# Patient Record
Sex: Male | Born: 1975 | Race: White | Hispanic: No | Marital: Married | Smoking: Former smoker
Health system: Southern US, Community
[De-identification: ages and names within clinical notes are randomized; demographics above are authoritative.]

## PROBLEM LIST (undated history)

## (undated) HISTORY — PX: APPENDECTOMY: SHX54

---

## 2013-06-13 ENCOUNTER — Emergency Department (HOSPITAL_COMMUNITY)
Admission: EM | Admit: 2013-06-13 | Discharge: 2013-06-13 | Disposition: A | Discharge status: Home / Self Care | Payer: Self-pay | Attending: Emergency Medicine | Admitting: Emergency Medicine

## 2013-06-13 ENCOUNTER — Encounter (HOSPITAL_COMMUNITY): Payer: Self-pay | Admitting: Emergency Medicine

## 2013-06-13 DIAGNOSIS — Z792 Long term (current) use of antibiotics: Secondary | ICD-10-CM | POA: Insufficient documentation

## 2013-06-13 DIAGNOSIS — K047 Periapical abscess without sinus: Secondary | ICD-10-CM | POA: Insufficient documentation

## 2013-06-13 MED ORDER — PENICILLIN V POTASSIUM 500 MG PO TABS: 500.0000 mg | ORAL_TABLET | Freq: Four times a day (QID) | ORAL | Status: DC

## 2013-06-13 MED ORDER — HYDROCODONE-ACETAMINOPHEN 5-325 MG PO TABS: 1.0000 | ORAL_TABLET | Freq: Four times a day (QID) | ORAL | Status: DC | PRN

## 2013-06-13 MED ORDER — PENICILLIN V POTASSIUM 500 MG PO TABS
500.0000 mg | ORAL_TABLET | Freq: Once | ORAL | Status: AC
  Administered 2013-06-13: 500 mg via ORAL
  Filled 2013-06-13: qty 1

## 2013-06-13 NOTE — ED Notes (Signed)
Pt present with facial swelling to left side of face. Has broken tooth and states its coming from that. Pt has hx of abscess to right side of face.

## 2013-06-13 NOTE — ED Provider Notes (Signed)
  CSN: [id number]     Arrival date & time 06/13/13  1049 History     First MD Initiated Contact with Patient 06/13/13 1116     Chief Complaint  Patient presents with  . Abscess   (Consider location/radiation/quality/duration/timing/severity/associated sxs/prior Treatment) HPI Comments: The patient presents to the emergency department with chief complaint of dental pain/abscess. Patient states that he recently suffered a broken tooth. He states that in the past 3 days, he has had worsening pain and swelling. He states that his pain is severe. He is tried managing it with ibuprofen with some relief. He has a history of dental pain and abscesses. He states that he does not have a dentist, but requests resources. He denies difficulty breathing, or swallowing. Denies fevers, chills, nausea, or vomiting.  The history is provided by the patient. No language interpreter was used.    History reviewed. No pertinent past medical history. Past Surgical History  Procedure Laterality Date  . Appendectomy     No family history on file. History  Substance Use Topics  . Smoking status: Never Smoker   . Smokeless tobacco: Not on file  . Alcohol Use: No    Review of Systems  All other systems reviewed and are negative.    Allergies  Review of patient's allergies indicates no known allergies.  Home Medications   Current Outpatient Rx  Name  Route  Sig  Dispense  Refill  . ibuprofen (ADVIL,MOTRIN) 200 MG tablet   Oral   Take 800 mg by mouth every 6 (six) hours as needed for pain.         Marland Kitchen HYDROcodone-acetaminophen (NORCO/VICODIN) 5-325 MG per tablet   Oral   Take 1 tablet by mouth every 6 (six) hours as needed for pain.   12 tablet   0   . penicillin v potassium (VEETID) 500 MG tablet   Oral   Take 1 tablet (500 mg total) by mouth 4 (four) times daily.   40 tablet   0    BP 137/71  Pulse 81  Temp(Src) 98.3 F (36.8 C) (Oral)  Resp 18  SpO2 100% Physical Exam  Nursing note  and vitals reviewed. Constitutional: He is oriented to person, place, and time. He appears well-developed and well-nourished.  HENT:  Head: Normocephalic and atraumatic.  Mouth/Throat:    Poor dentition throughout.  Affected tooth as diagrammed.  No signs of peritonsillar or tonsillar abscess.  No signs of gingival abscess. Oropharynx is clear and without exudates.  Uvula is midline.  Airway is intact. No signs of Ludwig's angina.   Eyes: Conjunctivae and EOM are normal.  Neck: Normal range of motion.  Cardiovascular: Normal rate.   Pulmonary/Chest: Effort normal.  Abdominal: He exhibits no distension.  Musculoskeletal: Normal range of motion.  Neurological: He is alert and oriented to person, place, and time.  Skin: Skin is dry.  Psychiatric: He has a normal mood and affect. His behavior is normal. Judgment and thought content normal.    ED Course   Procedures (including critical care time)  Labs Reviewed - No data to display No results found. 1. Dental abscess     MDM  Patient with dental pain/abscess. Patient has mildly swollen left cheek. Will treat with penicillin and pain medicine.  Urged patient to follow-up with dentist.  Specific return precautions are given. The patient is stable and ready for discharge.   Roxy Horseman, PA-C 06/13/13 1159

## 2013-06-14 NOTE — ED Provider Notes (Signed)
Medical screening examination/treatment/procedure(s) were performed by non-physician practitioner and as supervising physician I was immediately available for consultation/collaboration.   Daejon Lich M Yanilen Adamik, MD 06/14/13 0735 

## 2013-06-15 ENCOUNTER — Emergency Department (HOSPITAL_COMMUNITY)
Admission: EM | Admit: 2013-06-15 | Discharge: 2013-06-15 | Disposition: A | Discharge status: Home / Self Care | Payer: Self-pay | Attending: Emergency Medicine | Admitting: Emergency Medicine

## 2013-06-15 ENCOUNTER — Encounter (HOSPITAL_COMMUNITY): Payer: Self-pay | Admitting: Emergency Medicine

## 2013-06-15 DIAGNOSIS — R609 Edema, unspecified: Secondary | ICD-10-CM | POA: Insufficient documentation

## 2013-06-15 DIAGNOSIS — K047 Periapical abscess without sinus: Secondary | ICD-10-CM | POA: Insufficient documentation

## 2013-06-15 MED ORDER — HYDROCODONE-ACETAMINOPHEN 5-325 MG PO TABS: 1.0000 | ORAL_TABLET | Freq: Four times a day (QID) | ORAL | Status: DC | PRN

## 2013-06-15 MED ORDER — AMOXICILLIN 500 MG PO CAPS
500.0000 mg | ORAL_CAPSULE | Freq: Once | ORAL | Status: AC
  Administered 2013-06-15: 500 mg via ORAL
  Filled 2013-06-15: qty 1

## 2013-06-15 MED ORDER — AMOXICILLIN 500 MG PO CAPS: 500.0000 mg | ORAL_CAPSULE | Freq: Three times a day (TID) | ORAL | Status: DC

## 2013-06-15 NOTE — ED Notes (Signed)
Pt presents with swelling to left side of face near mouth and nose. Pain 10/10. States its an abscess and has been like this since Monday.

## 2013-06-15 NOTE — ED Provider Notes (Signed)
CSN: [id number]     Arrival date & time 06/15/13  1350 History     First MD Initiated Contact with Patient 06/15/13 1355     Chief Complaint  Patient presents with  . Abscess   (Consider location/radiation/quality/duration/timing/severity/associated sxs/prior Treatment) HPI Comments: Patient presenting with pain of the left upper tooth that has been present for the past weeks.  He reports that the pain is associated with some swelling of the left side of his face that has been present for the past 2-3 days.  Swelling and pain gradually worsening.  He was seen in the ED for the same two days ago and was started on PCN.  He reports that typically Amoxicillin works better for dental abscesses than the PCN and is requesting to have the antibiotic changed.  He has been taking Hydrocodone for the pain, which he reports is helping.  He reports that he currently does not have a dentist.  He denies fever or chills.  Denies trismus.  Denies difficulty swallowing.  Patient is a 37 y.o. male presenting with tooth pain. The history is provided by the patient.  Dental Pain Associated symptoms: facial pain, facial swelling and gum swelling   Associated symptoms: no difficulty swallowing, no drooling, no fever, no neck pain, no neck swelling and no trismus     History reviewed. No pertinent past medical history. Past Surgical History  Procedure Laterality Date  . Appendectomy     History reviewed. No pertinent family history. History  Substance Use Topics  . Smoking status: Never Smoker   . Smokeless tobacco: Not on file  . Alcohol Use: No    Review of Systems  Constitutional: Negative for fever.  HENT: Positive for facial swelling. Negative for drooling and neck pain.     Allergies  Review of patient's allergies indicates no known allergies.  Home Medications   Current Outpatient Rx  Name  Route  Sig  Dispense  Refill  . HYDROcodone-acetaminophen (NORCO/VICODIN) 5-325 MG per tablet  Oral   Take 1 tablet by mouth every 6 (six) hours as needed for pain.   12 tablet   0   . ibuprofen (ADVIL,MOTRIN) 200 MG tablet   Oral   Take 800 mg by mouth every 6 (six) hours as needed for pain.         Marland Kitchen penicillin v potassium (VEETID) 500 MG tablet   Oral   Take 1 tablet (500 mg total) by mouth 4 (four) times daily.   40 tablet   0    BP 151/85  Pulse 72  Temp(Src) 98.4 F (36.9 C) (Oral)  Resp 20  SpO2 100% Physical Exam  Nursing note and vitals reviewed. Constitutional: He is oriented to person, place, and time. He appears well-developed and well-nourished. No distress.  HENT:  Head: Normocephalic and atraumatic.  Mouth/Throat: Uvula is midline, oropharynx is clear and moist and mucous membranes are normal. No trismus in the jaw. Abnormal dentition. No dental abscesses or edematous. No oropharyngeal exudate, posterior oropharyngeal edema, posterior oropharyngeal erythema or tonsillar abscesses.  Poor dental hygiene. Pt able to open and close mouth with out difficulty. Airway intact. Uvula midline. Dental abscess of the left upper gingiva.   No swelling or tenderness of submental and submandibular regions.  Eyes: Conjunctivae and EOM are normal.  Neck: Normal range of motion and full passive range of motion without pain. Neck supple.  Cardiovascular: Normal rate, regular rhythm and normal heart sounds.   Pulmonary/Chest: Effort normal and breath  sounds normal.  Musculoskeletal: Normal range of motion.  Lymphadenopathy:       Head (right side): No submental, no submandibular, no tonsillar, no preauricular and no posterior auricular adenopathy present.       Head (left side): No submental, no submandibular, no tonsillar, no preauricular and no posterior auricular adenopathy present.    He has no cervical adenopathy.  Neurological: He is alert and oriented to person, place, and time.  Skin: Skin is warm and dry. He is not diaphoretic.  Psychiatric: He has a normal mood  and affect.    ED Course   Procedures (including critical care time)  Labs Reviewed - No data to display No results found. No diagnosis found.  INCISION AND DRAINAGE Performed by: Anne Shutter, Jaimi Belle Consent: Verbal consent obtained. Risks and benefits: risks, benefits and alternatives were discussed Type: abscess  Body area: left upper gingiva  Anesthesia: local infiltration  Incision was made with a scalpel.  Local anesthetic: lidocaine 2% with epinephrine  Anesthetic total: 2 ml  Complexity: complex Blunt dissection to break up loculations  Drainage: purulent  Drainage amount: moderate  Patient tolerance: Patient tolerated the procedure well with no immediate complications.     MDM  Patient presenting with left upper dental abscess.  Abscess incised and drained in the ED.  Marland Kitchen  Exam unconcerning for Ludwig's angina or spread of infection.  Patient reports that Amoxicillin works better for him than PCN when he has had dental abscesses in the past.  Therefore, patient started on Amoxicillin.   Urged patient to follow-up with dentist.    Pascal Lux Blanchard, PA-C 06/15/13 1526

## 2013-06-16 NOTE — ED Provider Notes (Signed)
Medical screening examination/treatment/procedure(s) were performed by non-physician practitioner and as supervising physician I was immediately available for consultation/collaboration.    Terrin Imparato W. Michela Herst, MD 06/16/13 1825 

## 2013-07-09 ENCOUNTER — Emergency Department (HOSPITAL_COMMUNITY)
Admission: EM | Admit: 2013-07-09 | Discharge: 2013-07-09 | Disposition: A | Discharge status: Home / Self Care | Payer: Self-pay | Attending: Emergency Medicine | Admitting: Emergency Medicine

## 2013-07-09 ENCOUNTER — Encounter (HOSPITAL_COMMUNITY): Payer: Self-pay

## 2013-07-09 DIAGNOSIS — K047 Periapical abscess without sinus: Secondary | ICD-10-CM | POA: Insufficient documentation

## 2013-07-09 MED ORDER — CLINDAMYCIN HCL 150 MG PO CAPS: 300.0000 mg | ORAL_CAPSULE | Freq: Three times a day (TID) | ORAL | Status: DC

## 2013-07-09 MED ORDER — AMOXICILLIN 500 MG PO CAPS: 1000.0000 mg | ORAL_CAPSULE | Freq: Two times a day (BID) | ORAL | Status: DC

## 2013-07-09 NOTE — ED Provider Notes (Signed)
Medical screening examination/treatment/procedure(s) were performed by non-physician practitioner and as supervising physician I was immediately available for consultation/collaboration.  Shon Baton, MD 07/09/13 1806

## 2013-07-09 NOTE — ED Notes (Signed)
Patient states he was here 1 1/2 weeks ago for the abscess.Patient states he received antibiotics and pain meds. patient states he took all of the antibiotics and his face swelled again. Patient states he has an appointment on 9/25 at a clinic

## 2013-07-09 NOTE — ED Provider Notes (Signed)
CSN: [id number]     Arrival date & time 07/09/13  1202 History   First MD Initiated Contact with Patient 07/09/13 1220     Chief Complaint  Patient presents with  . dental abscess    (Consider location/radiation/quality/duration/timing/severity/associated sxs/prior Treatment) Patient is a 37 y.o. male presenting with tooth pain. The history is provided by the patient. No language interpreter was used.  Dental Pain Location:  Upper Upper teeth location:  11/LU cuspid Quality:  Dull Severity:  Moderate Associated symptoms: facial swelling   Associated symptoms: no fever   Associated symptoms comment:  Recurrence of left sided facial swelling and pain involving the upper left canine tooth. No fever. No N, V. He has follow up on the 25th of this month.   History reviewed. No pertinent past medical history. Past Surgical History  Procedure Laterality Date  . Appendectomy     Family History  Problem Relation Age of Onset  . Heart failure Father   . Cancer Father    History  Substance Use Topics  . Smoking status: Never Smoker   . Smokeless tobacco: Never Used  . Alcohol Use: No    Review of Systems  Constitutional: Negative for fever and chills.  HENT: Positive for facial swelling and dental problem. Negative for ear pain and trouble swallowing.        Toothache.  Gastrointestinal: Negative.  Negative for nausea.  Neurological: Negative.     Allergies  Review of patient's allergies indicates no known allergies.  Home Medications   Current Outpatient Rx  Name  Route  Sig  Dispense  Refill  . ibuprofen (ADVIL,MOTRIN) 200 MG tablet   Oral   Take 600 mg by mouth every 6 (six) hours as needed for pain.           BP 137/89  Pulse 70  Temp(Src) 98.1 F (36.7 C) (Oral)  Ht 6\' 1"  (1.854 m)  Wt 218 lb (98.884 kg)  BMI 28.77 kg/m2  SpO2 98% Physical Exam  Constitutional: He appears well-developed and well-nourished. No distress.  HENT:  Left sided facial swelling  adjacent to #11. Moderate to severe dental decay of this tooth. No pointing abscess visualized.     ED Course  Procedures (including critical care time) Labs Review Labs Reviewed - No data to display Imaging Review No results found.  MDM  No diagnosis found. 1. Recurrent dental abscess  Last Rx for Amoxil was 7 days, at which time he was having recurrent symptoms of pain and swelling. He prefers Amoxil. Will write for 10 day course, also given a Rx for Clinda if pain/swelling are not resolving with Amoxil.     Arnoldo Hooker, PA-C 07/09/13 1249

## 2019-07-19 DIAGNOSIS — H5213 Myopia, bilateral: Secondary | ICD-10-CM | POA: Diagnosis not present

## 2019-08-18 DIAGNOSIS — H5213 Myopia, bilateral: Secondary | ICD-10-CM | POA: Diagnosis not present

## 2019-08-27 HISTORY — PX: DENTAL SURGERY: SHX609

## 2019-11-14 ENCOUNTER — Ambulatory Visit (INDEPENDENT_AMBULATORY_CARE_PROVIDER_SITE_OTHER): Payer: Medicaid Other | Admitting: Internal Medicine

## 2019-11-14 ENCOUNTER — Ambulatory Visit (INDEPENDENT_AMBULATORY_CARE_PROVIDER_SITE_OTHER): Payer: Medicaid Other

## 2019-11-14 ENCOUNTER — Other Ambulatory Visit: Payer: Self-pay

## 2019-11-14 VITALS — BP 149/94 | HR 73 | Temp 97.3°F | Resp 17 | Ht 71.0 in | Wt 285.6 lb

## 2019-11-14 DIAGNOSIS — M5412 Radiculopathy, cervical region: Secondary | ICD-10-CM

## 2019-11-14 DIAGNOSIS — M25512 Pain in left shoulder: Secondary | ICD-10-CM

## 2019-11-14 DIAGNOSIS — Z720 Tobacco use: Secondary | ICD-10-CM | POA: Diagnosis not present

## 2019-11-14 DIAGNOSIS — R030 Elevated blood-pressure reading, without diagnosis of hypertension: Secondary | ICD-10-CM | POA: Insufficient documentation

## 2019-11-14 DIAGNOSIS — Z7689 Persons encountering health services in other specified circumstances: Secondary | ICD-10-CM

## 2019-11-14 DIAGNOSIS — M4802 Spinal stenosis, cervical region: Secondary | ICD-10-CM | POA: Diagnosis not present

## 2019-11-14 DIAGNOSIS — M19012 Primary osteoarthritis, left shoulder: Secondary | ICD-10-CM | POA: Diagnosis not present

## 2019-11-14 MED ORDER — GABAPENTIN 300 MG PO CAPS: 300.0000 mg | ORAL_CAPSULE | Freq: Every day | ORAL | 2 refills | Status: DC

## 2019-11-14 MED ORDER — MELOXICAM 15 MG PO TABS: 15.0000 mg | ORAL_TABLET | Freq: Every day | ORAL | 2 refills | Status: DC

## 2019-11-14 NOTE — Progress Notes (Signed)
Patient ID: Kevin Stephenson, male    DOB: 1976-07-26  MRN: [id number]  CC: Establish Care and Shoulder Pain (L shoulder pain x 5-6 months. no known injury. varies from dull ache to sharp stabbing pain. has some numbness/tingling radiating down his left arm. when it bads he'll drop things if he's holding it in his L hand)   Subjective: Kevin Stephenson is a 44 y.o. male who presents for new pt visit at [facility] His concerns today include:   No previous PCP.  No chronic med issues.  C/o pain in LT shoulder x 5-6 mths.  Constant but severity changes.  Started gradually with intermittent pain but got worse and now constant.   Yesterday he slept on it.  Could not move it without using his other hand. Can only lift it to a little less than 90 degress. +Weakness in grip and over all strength in the arm.  Drop things at times when trying to grip with LT hand Intermittent numbness in arm and hand Hurts to lay on LT side.  No known initiating factors Endorses neck pain on LT side for a few yrs.  Some days pain in neck  is okay and other days he feels it.  Radiates toward LT shoulder.  + shooting pain down the arm from shoulder intermittently in mornings.  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  On good days pain 4-5/10 and bad days 20/10.    BP elev today.  Pt reports BP always high when he is in doctor's office.  But goes down after sitting for 5-10 mins.   No CP/SOB with exertion.  No LE edema.   Tob Dep:  Quit 5 yrs ago.  Now vapes. Had quit but started vaping again 04/2019 due to stress of COVID.  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   Past medical, surgical, family history, social history reviewed and updated  There are no problems to display for this patient.    No current outpatient medications on file prior to visit.   No current  facility-administered medications on file prior to visit.    No Known Allergies  Social History   Socioeconomic History  . Marital status: Married    Spouse name: Not on file  . Number of children: 3  . Years of education: 28  . Highest education level: Not on file  Occupational History  . Not on file  Tobacco Use  . Smoking status: Former Smoker    Quit date: 2015    Years since quitting: 6.0  . Smokeless tobacco: Never Used  Substance and Sexual Activity  . Alcohol use: No  . Drug use: No  . Sexual activity: Not on file  Other Topics Concern  . Not on file  Social History Narrative  . Not on file   Social Determinants of Health   Financial Resource Strain:   . Difficulty of Paying Living Expenses: Not on file  Food Insecurity:   . Worried About Programme researcher, broadcasting/film/video in the Last Year: Not on file  . Ran Out of Food in the Last Year: Not on file  Transportation Needs:   . Lack of Transportation (Medical): Not on file  . Lack of Transportation (Non-Medical): Not on file  Physical Activity:   . Days of Exercise per Week: Not  on file  . Minutes of Exercise per Session: Not on file  Stress:   . Feeling of Stress : Not on file  Social Connections:   . Frequency of Communication with Friends and Family: Not on file  . Frequency of Social Gatherings with Friends and Family: Not on file  . Attends Religious Services: Not on file  . Active Member of Clubs or Organizations: Not on file  . Attends Archivist Meetings: Not on file  . Marital Status: Not on file  Intimate Partner Violence:   . Fear of Current or Ex-Partner: Not on file  . Emotionally Abused: Not on file  . Physically Abused: Not on file  . Sexually Abused: Not on file    Family History  Problem Relation Age of Onset  . Heart failure Father   . Colon cancer Father   . Heart disease Father     Past Surgical History:  Procedure Laterality Date  . APPENDECTOMY      ROS: Review of  Systems Negative except as stated above  PHYSICAL EXAM: BP (!) 154/89   Pulse 73   Temp (!) 97.3 F (36.3 C) (Temporal)   Resp 17   Ht 5\' 11"  (1.803 m)   Wt 285 lb 9.6 oz (129.5 kg)   SpO2 98%   BMI 39.83 kg/m   149/94.  Then 136/98 Physical Exam General appearance - alert, well appearing, middle-aged Caucasian male and in no distress Mental status - normal mood, behavior, speech, dress, motor activity, and thought processes Eyes - pupils equal and reactive, extraocular eye movements intact Mouth -patient is edentulous Neck -no cervical lymphadenopathy.  No thyromegaly Chest - clear to auscultation, no wheezes, rales or rhonchi, symmetric air entry Heart - normal rate, regular rhythm, normal S1, S2, no murmurs, rubs, clicks or gallops Neurological -grip 5/5 right, 4/5 left.  Power proximally and distally in the upper extremities 5/5 bilaterally.  He has mild discomfort with passive range of motion in all directions.  Drop arm test negative.  He is able to rotate the arm around to his back.  He experiences most discomfort with passive elevation of the arm to about 40 to 50 degrees. Decreased sensation to gross touch in the left forearm compared to the right. Musculoskeletal -no tenderness on palpation of the cervical spine.  Mild discomfort with passive rotation of the neck to the left. Left shoulder joint: Patient of the anterior posterior joint. Extremities -no lower extremity edema.  3+ radial pulses  Patient declines baseline lab test today  ASSESSMENT AND PLAN: 1. Encounter to establish care 2. Pain in joint of left shoulder Patient appears to have issues with the left shoulder and also some radicular symptoms that seem to be coming from the neck.  I recommend that we start with baseline x-rays of the left shoulder and the cervical spine. Stop ibuprofen.  Start meloxicam.  Start low-dose of gabapentin at bedtime. Refer to orthopedics - DG Shoulder Left; Future - Ambulatory  referral to Orthopedic Surgery - meloxicam (MOBIC) 15 MG tablet; Take 1 tablet (15 mg total) by mouth daily.  Dispense: 30 tablet; Refill: 2  3. Cervical radicular pain See #1 above - DG Cervical Spine Complete; Future - Ambulatory referral to Orthopedic Surgery - gabapentin (NEURONTIN) 300 MG capsule; Take 1 capsule (300 mg total) by mouth at bedtime.  Dispense: 30 capsule; Refill: 2  4. Elevated blood pressure reading DASH diet discussed and encouraged.  I have given him a prescription to  get home blood pressure monitoring device.  Advised to check blood pressure at least once or twice a week and bring in readings on next visit.  5. Vapes nicotine containing substance Commended him on quitting smoking.  However I have discussed the risks of vaping.  Encouraged him to discontinue.  Patient states that he is weaning himself plans to quit    Patient was given the opportunity to ask questions.  Patient verbalized understanding of the plan and was able to repeat key elements of the plan.   Orders Placed This Encounter  Procedures  . DG Cervical Spine Complete  . DG Shoulder Left  . Ambulatory referral to Orthopedic Surgery     Requested Prescriptions   Signed Prescriptions Disp Refills  . meloxicam (MOBIC) 15 MG tablet 30 tablet 2    Sig: Take 1 tablet (15 mg total) by mouth daily.  Marland Kitchen gabapentin (NEURONTIN) 300 MG capsule 30 capsule 2    Sig: Take 1 capsule (300 mg total) by mouth at bedtime.    Return in about 6 weeks (around 12/26/2019) for for BP recheck.  Jonah Blue, MD, FACP

## 2019-11-14 NOTE — Patient Instructions (Signed)
Your blood pressure is elevated.  Normal is 120/80 or lower.  Try to limit salt in the foods.  Try to get a blood pressure monitoring device and check your blood pressure 1-2 times a week and bring in readings on next visit.    I have referred you to orthopedics for your shoulder/neck.   Start Gabapentin 300 mg at bedtime.  This is for nerve pain.  Start Meloxicam daily.  This is an antiinflammatory medication.  Stop the Ibuprofen

## 2019-11-16 NOTE — Progress Notes (Signed)
Patient notified of results & recommendations via voicemail(per his request). Advised to call back if he had any questions.

## 2019-11-23 ENCOUNTER — Encounter: Payer: Self-pay | Admitting: Family Medicine

## 2019-11-23 ENCOUNTER — Other Ambulatory Visit: Payer: Self-pay

## 2019-11-23 ENCOUNTER — Ambulatory Visit (INDEPENDENT_AMBULATORY_CARE_PROVIDER_SITE_OTHER): Payer: Medicaid Other | Admitting: Family Medicine

## 2019-11-23 DIAGNOSIS — G8929 Other chronic pain: Secondary | ICD-10-CM

## 2019-11-23 DIAGNOSIS — M25512 Pain in left shoulder: Secondary | ICD-10-CM | POA: Diagnosis not present

## 2019-11-23 MED ORDER — PREDNISONE 10 MG PO TABS
ORAL_TABLET | ORAL | 0 refills | Status: DC
Start: 2019-11-23 — End: 2019-12-25

## 2019-11-23 NOTE — Patient Instructions (Signed)
  Shoulder arthritis treatment:  Glucosamine Sulfate:  1,000 mg twice daily  Turmeric:  500 mg twice daily  Theraband exercises 3 days per week (start when pain is better)   Future options: - Physical therapy - Cortisone injection

## 2019-11-23 NOTE — Progress Notes (Signed)
Office Visit Note   Patient: Kevin Stephenson           Date of Birth: 09-24-1976           MRN: [id number] Visit Date: 11/23/2019 Requested by: Marcine Matar, MD [address] [place],  [place] [address] PCP: No primary care provider on file.  Subjective: Chief Complaint  Patient presents with  . Left Shoulder - Pain    Pain x 5-6 months, NKI. Worse over the past 2 months. Less strength in the left arm. Occasional numbness in the left hand. Some days are worse than others. Occasional problems sleeping due to pain.    HPI: He is here with left shoulder pain.  No injury, gradual onset of pain about 5 or 6 months ago.  Pain deep inside the shoulder, he points to the anterior aspect.  He cannot reproduce the pain by palpation.  It hurts constantly, keeps him from sleeping at night.  He recently started gabapentin and meloxicam with minimal improvement.  No previous shoulder dislocations or major trauma.  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  He has some neck pain as well with occasional radicular symptoms into his left arm.  He recently had x-rays of cervical spine and left shoulder and now presents for evaluation.  He is right-hand dominant.  No family history of rheumatologic disease to his knowledge.  He is a former smoker.                ROS: No fevers or chills.  All other systems were reviewed and are negative.  Objective: Vital Signs: There were no vitals taken for this visit.  Physical Exam:  General:  Alert and oriented, in no acute distress. Pulm:  Breathing unlabored. Psy:  Normal mood, congruent affect. Skin: No rash Left shoulder: He has decreased overhead reach compared to the right, and slightly decreased behind the back reach.  Isometric rotator cuff strength is 5/5, biceps, triceps, wrist and intrinsic hand strength are 5/5.  He has no pain with rotator cuff testing.  No tenderness at  the [facility] joint.  Imaging: X-rays of the cervical spine show moderate to severe lower cervical degenerative disc disease.  X-rays left shoulder show moderate glenohumeral degenerative change with subchondral cystic change.  Moderate to severe AC joint arthropathy.    Assessment & Plan: 1.  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  Home exercises given for maintenance. -If symptoms persist, could try physical therapy or glenohumeral injection.    Procedures: No procedures performed  No notes on file     PMFS History: Patient Active Problem List   Diagnosis Date Noted  . Pain in joint of left shoulder 11/14/2019  . Cervical radicular pain 11/14/2019  . Elevated blood pressure reading 11/14/2019  . Vapes nicotine containing substance 11/14/2019   History reviewed. No pertinent past medical history.  Family History  Problem Relation Age of Onset  . Heart failure Father   . Colon cancer Father   . Heart disease Father     Past Surgical History:  Procedure Laterality Date  . APPENDECTOMY    . DENTAL SURGERY  08/2019   all teeth removed   Social History   Occupational History  . Occupation: Truck Hospital doctor  Tobacco Use  . Smoking status: Former Smoker    Quit date: 2015  Years since quitting: 6.0  . Smokeless tobacco: Never Used  Substance and Sexual Activity  . Alcohol use: No  . Drug use: No  . Sexual activity: Not on file

## 2019-12-18 ENCOUNTER — Other Ambulatory Visit: Payer: Self-pay | Admitting: Internal Medicine

## 2019-12-25 ENCOUNTER — Telehealth: Payer: Self-pay

## 2019-12-25 NOTE — Telephone Encounter (Signed)

## 2019-12-25 NOTE — Patient Instructions (Addendum)
Thank you for choosing Primary Care at [facility] to be your medical home!    Kevin Stephenson was seen by De Hollingshead, DO today.   Kevin Stephenson's primary care provider is [name], DO.   For the best care possible, you should try to see [name], DO whenever you come to the clinic.   We look forward to seeing you again soon!  If you have any questions about your visit today, please call us at [phone] or feel free to reach your primary care provider via MyChart.      Low-Purine Eating Plan A low-purine eating plan involves making food choices to limit your intake of purine. Purine is a kind of uric acid. Too much uric acid in your blood can cause certain conditions, such as gout and kidney stones. Eating a low-purine diet can help control these conditions. What are tips for following this plan? Reading food labels   Avoid foods with saturated or Trans fat.  Check the ingredient list of grains-based foods, such as bread and cereal, to make sure that they contain whole grains.  Check the ingredient list of sauces or soups to make sure they do not contain meat or fish.  When choosing soft drinks, check the ingredient list to make sure they do not contain high-fructose corn syrup. Shopping  Buy plenty of fresh fruits and vegetables.  Avoid buying canned or fresh fish.  Buy dairy products labeled as low-fat or nonfat.  Avoid buying premade or processed foods. These foods are often high in fat, salt (sodium), and added sugar. Cooking  Use olive oil instead of butter when cooking. Oils like olive oil, canola oil, and sunflower oil contain healthy fats. Meal planning  Learn which foods do or do not affect you. If you find out that a food tends to cause your gout symptoms to flare up, avoid eating that food. You can enjoy foods that do not cause problems. If you have any questions about a food item, talk with your dietitian or health care  provider.  Limit foods high in fat, especially saturated fat. Fat makes it harder for your body to get rid of uric acid.  Choose foods that are lower in fat and are lean sources of protein. General guidelines  Limit alcohol intake to no more than 1 drink a day for nonpregnant women and 2 drinks a day for men. One drink equals 12 oz of beer, 5 oz of wine, or 1 oz of hard liquor. Alcohol can affect the way your body gets rid of uric acid.  Drink plenty of water to keep your urine clear or pale yellow. Fluids can help remove uric acid from your body.  If directed by your health care provider, take a vitamin C supplement.  Work with your health care provider and dietitian to develop a plan to achieve or maintain a healthy weight. Losing weight can help reduce uric acid in your blood. What foods are recommended? The items listed may not be a complete list. Talk with your dietitian about what dietary choices are best for you. Foods low in purines Foods low in purines do not need to be limited. These include:  All fruits.  All low-purine vegetables, pickles, and olives.  Breads, pasta, rice, cornbread, and popcorn. Cake and other baked goods.  All dairy foods.  Eggs, nuts, and nut butters.  Spices and condiments, such as salt, herbs, and vinegar.  Plant oils, butter, and margarine.  Water, sugar-free soft  drinks, tea, coffee, and cocoa.  Vegetable-based soups, broths, sauces, and gravies. Foods moderate in purines Foods moderate in purines should be limited to the amounts listed.   cup of asparagus, cauliflower, spinach, mushrooms, or green peas, each day.  2/3 cup uncooked oatmeal, each day.   cup dry wheat bran or wheat germ, each day.  2-3 ounces of meat or poultry, each day.  4-6 ounces of shellfish, such as crab, lobster, oysters, or shrimp, each day.  1 cup cooked beans, peas, or lentils, each day.  Soup, broths, or bouillon made from meat or fish. Limit these  foods as much as possible. What foods are not recommended? The items listed may not be a complete list. Talk with your dietitian about what dietary choices are best for you. Limit your intake of foods high in purines, including:  Beer and other alcohol.  Meat-based gravy or sauce.  Canned or fresh fish, such as: ? Anchovies, sardines, herring, and tuna. ? Mussels and scallops. ? Codfish, trout, and haddock.  Kevin Stephenson.  Organ meats, such as: ? Liver or kidney. ? Tripe. ? Sweetbreads (thymus gland or pancreas).  Wild Clinical biochemist.  Yeast or yeast extract supplements.  Drinks sweetened with high-fructose corn syrup. Summary  Eating a low-purine diet can help control conditions caused by too much uric acid in the body, such as gout or kidney stones.  Choose low-purine foods, limit alcohol, and limit foods high in fat.  You will learn over time which foods do or do not affect you. If you find out that a food tends to cause your gout symptoms to flare up, avoid eating that food. This information is not intended to replace advice given to you by your health care provider. Make sure you discuss any questions you have with your health care provider. Document Revised: 09/24/2017 Document Reviewed: 11/25/2016 Elsevier Patient Education  2020 [employer].

## 2019-12-26 ENCOUNTER — Other Ambulatory Visit: Payer: Self-pay

## 2019-12-26 ENCOUNTER — Encounter: Payer: Self-pay | Admitting: Internal Medicine

## 2019-12-26 ENCOUNTER — Ambulatory Visit (INDEPENDENT_AMBULATORY_CARE_PROVIDER_SITE_OTHER): Payer: Medicaid Other | Admitting: Internal Medicine

## 2019-12-26 VITALS — BP 133/90 | HR 61 | Temp 97.5°F | Resp 17 | Wt 297.4 lb

## 2019-12-26 DIAGNOSIS — M25512 Pain in left shoulder: Secondary | ICD-10-CM | POA: Diagnosis not present

## 2019-12-26 DIAGNOSIS — Z114 Encounter for screening for human immunodeficiency virus [HIV]: Secondary | ICD-10-CM | POA: Diagnosis not present

## 2019-12-26 DIAGNOSIS — M109 Gout, unspecified: Secondary | ICD-10-CM

## 2019-12-26 DIAGNOSIS — R030 Elevated blood-pressure reading, without diagnosis of hypertension: Secondary | ICD-10-CM

## 2019-12-26 MED ORDER — MELOXICAM 15 MG PO TABS: 15.0000 mg | ORAL_TABLET | Freq: Every day | ORAL | 2 refills | Status: DC

## 2019-12-26 MED ORDER — COLCHICINE 0.6 MG PO TABS
0.6000 mg | ORAL_TABLET | Freq: Two times a day (BID) | ORAL | 0 refills | Status: DC
Start: 2019-12-26 — End: 2019-12-27

## 2019-12-26 NOTE — Progress Notes (Signed)
Subjective:    Kevin Stephenson - 44 y.o. male MRN [id number]  Date of birth: September 27, 1976  HPI  Charlies Stephenson is here for follow up of elevated BP reading. Patient was seen to establish care on 1/19. Noted to have BP of 154/89 without h/o HTN. DASH diet discussed and recommended patient start monitoring BPs at home. Today, he reports that he has been monitoring his BP at home. Numbers have all been 749S systolic or less. No chest pain, headaches, SOB, vision changes.    He is concerned that he may be having a gout flare in his left ankle. He believes he has had them in his big toes before. Never been diagnosed with gout but has had it where his joint gets red, swollen, and warm. He has self treated with NSAIDs in the past. Tried this time and not going away. No known precipitating event or injury. Has full ROM with his ankle. Pain feels deep within the joint.       Health Maintenance:  Health Maintenance Due  Topic Date Due  . HIV Screening  02/05/1991    -  reports that he quit smoking about 6 years ago. He has never used smokeless tobacco. - Review of Systems: Per HPI. - Past Medical History: Patient Active Problem List   Diagnosis Date Noted  . Pain in joint of left shoulder 11/14/2019  . Cervical radicular pain 11/14/2019  . Elevated blood pressure reading 11/14/2019  . Vapes nicotine containing substance 11/14/2019   - Medications: reviewed and updated   Objective:   Physical Exam BP 133/90   Pulse 61   Temp (!) 97.5 F (36.4 C) (Temporal)   Resp 17   Wt 297 lb 6.4 oz (134.9 kg)   SpO2 98%   BMI 41.48 kg/m  Physical Exam  Constitutional: He is oriented to person, place, and time and well-developed, well-nourished, and in no distress. No distress.  HENT:  Head: Normocephalic and atraumatic.  Eyes: Conjunctivae and EOM are normal.  Cardiovascular: Normal rate, regular rhythm and normal heart sounds.  No murmur heard. Pulmonary/Chest: Effort normal and breath  sounds normal. No respiratory distress.  Musculoskeletal:        General: Normal range of motion.     Comments: Left ankle with some edema and increased warmth, no erythema. No pain to palpation. Full ROM at joint.   Neurological: He is alert and oriented to person, place, and time.  Skin: Skin is warm and dry. He is not diaphoretic.  Psychiatric: Affect and judgment normal.           Assessment & Plan:   1. Elevated blood pressure reading BP improved greatly this time and on home readings. Will continue to monitor. Encouraged DASH diet and increased exercise as well as weight loss for improved BP control over time.   2. Screening for HIV (human immunodeficiency virus) - HIV antibody (with reflex)  3. Pain in joint of left shoulder - meloxicam (MOBIC) 15 MG tablet; Take 1 tablet (15 mg total) by mouth daily.  Dispense: 30 tablet; Refill: 2  4. Gout of left ankle, unspecified cause, unspecified chronicity History and symptoms concerning for gout. No known injury or exam findings that would warrant imaging at present. Will obtain BMET as don't know baseline kidney function. In meantime will prescribe Colchicine for presumed gout flare to take BID until flare resolves. Can continue NSAID for treatment.  - Uric Acid - Basic Metabolic Panel - colchicine 0.6 MG tablet; Take 1  tablet (0.6 mg total) by mouth 2 (two) times daily.  Dispense: 30 tablet; Refill: 0     Kevin Stephenson, D.O. 12/26/2019, 10:57 AM Primary Care at [facility]

## 2019-12-27 ENCOUNTER — Other Ambulatory Visit: Payer: Self-pay

## 2019-12-27 LAB — BASIC METABOLIC PANEL
BUN/Creatinine Ratio: 16 (ref 9–20)
BUN: 16 mg/dL (ref 6–24)
CO2: 24 mmol/L (ref 20–29)
Calcium: 9.6 mg/dL (ref 8.7–10.2)
Chloride: 103 mmol/L (ref 96–106)
Creatinine, Ser: 1 mg/dL (ref 0.76–1.27)
GFR calc Af Amer: 106 mL/min/{1.73_m2} (ref >59)
GFR calc non Af Amer: 92 mL/min/{1.73_m2} (ref >59)
Glucose: 84 mg/dL (ref 65–99)
Potassium: 4 mmol/L (ref 3.5–5.2)
Sodium: 142 mmol/L (ref 134–144)

## 2019-12-27 LAB — URIC ACID: Uric Acid: 6.3 mg/dL (ref 3.8–8.4)

## 2019-12-27 LAB — HIV ANTIBODY (ROUTINE TESTING W REFLEX): HIV Screen 4th Generation wRfx: NONREACTIVE

## 2019-12-27 MED ORDER — MITIGARE 0.6 MG PO CAPS: ORAL_CAPSULE | ORAL | 0 refills | Status: DC

## 2019-12-27 NOTE — Progress Notes (Signed)
Received prior authorization request for patient's Colchicine tablets. Checked the Medicaid preferred drug list & Medicaid prefers the Mitigare capsules. Sent Rx for Mitagare to pharmacy on file.

## 2019-12-27 NOTE — Progress Notes (Signed)
Patient notified of results & recommendations. Expressed understanding.

## 2020-03-07 ENCOUNTER — Other Ambulatory Visit: Payer: Self-pay | Admitting: Internal Medicine

## 2020-03-07 MED ORDER — MITIGARE 0.6 MG PO CAPS: ORAL_CAPSULE | ORAL | 0 refills | Status: DC

## 2020-06-27 ENCOUNTER — Telehealth: Payer: Medicaid Other | Admitting: Internal Medicine

## 2020-07-17 ENCOUNTER — Telehealth: Payer: Medicaid Other

## 2020-10-10 ENCOUNTER — Ambulatory Visit (INDEPENDENT_AMBULATORY_CARE_PROVIDER_SITE_OTHER): Payer: Medicaid Other | Admitting: Internal Medicine

## 2020-10-10 ENCOUNTER — Other Ambulatory Visit: Payer: Self-pay

## 2020-10-10 ENCOUNTER — Encounter: Payer: Self-pay | Admitting: Internal Medicine

## 2020-10-10 VITALS — BP 148/98 | HR 70 | Temp 97.9°F | Resp 17 | Wt 325.0 lb

## 2020-10-10 DIAGNOSIS — M5412 Radiculopathy, cervical region: Secondary | ICD-10-CM | POA: Diagnosis not present

## 2020-10-10 DIAGNOSIS — M19012 Primary osteoarthritis, left shoulder: Secondary | ICD-10-CM | POA: Diagnosis not present

## 2020-10-10 DIAGNOSIS — F411 Generalized anxiety disorder: Secondary | ICD-10-CM | POA: Diagnosis not present

## 2020-10-10 MED ORDER — SERTRALINE HCL 25 MG PO TABS: ORAL_TABLET | ORAL | 1 refills | Status: DC

## 2020-10-10 NOTE — Progress Notes (Signed)
Subjective:    Kevin Stephenson - 44 y.o. male MRN [id number]  Date of birth: [birth year]  HPI  Kevin Stephenson is here for follow up of neck pain. Has been present for about 18 months in cervical spine radiating down back and also experiences left shoulder pain with pain and numbness radiating down left arm. Was seen for this on 11/14/19 and had imaging obtained. Then had f/u with orthopedics 1/28 and was given 7 day course of steroids which he reports did not improve pain. He was unable to return as they did not accept his insurance. Reports he is unable to work or do any type of physical activity without severe symptoms. Has reduced grip strength and frequently drops items. Has to keep his head positioned in certain ways to avoid pain. Sometimes feels off balance. Most pain relief he is able to achieve comes from [facility] edibles. Tylenol arthritis helps somewhat.    Suffering from a lot of anxiety. Feels overwhelmed by his thoughts and keeps thinking about the same things repetitively. Feels like he then has trouble focusing on tasks and easily forgets what he is supposed to be doing. Has gotten a lot worse over past few months. Thinks may be related to being at home so much due to his pain and limitations as above.   Health Maintenance:  Health Maintenance Due  Topic Date Due  . Hepatitis C Screening  Never done  . COVID-19 Vaccine (1) Never done  . INFLUENZA VACCINE  Never done    -  reports that he quit smoking about 6 years ago. He has never used smokeless tobacco. - Review of Systems: Per HPI. - Past Medical History: Patient Active Problem List   Diagnosis Date Noted  . Pain in joint of left shoulder 11/14/2019  . Cervical radicular pain 11/14/2019  . Elevated blood pressure reading 11/14/2019  . Vapes nicotine containing substance 11/14/2019   - Medications: reviewed and updated   Objective:   Physical Exam BP (!) 148/98   Pulse 70   Temp 97.9 F (36.6 C) (Temporal)   Resp 17    Wt (!) 325 lb (147.4 kg)   SpO2 96%   BMI 45.33 kg/m  Physical Exam Constitutional:      General: He is not in acute distress.    Appearance: He is not diaphoretic.  Cardiovascular:     Rate and Rhythm: Normal rate.  Pulmonary:     Effort: Pulmonary effort is normal. No respiratory distress.  Musculoskeletal:        General: Normal range of motion.  Skin:    General: Skin is warm and dry.  Neurological:     Mental Status: He is alert and oriented to person, place, and time.  Psychiatric:        Mood and Affect: Affect normal.        Judgment: Judgment normal.            Assessment & Plan:   1. Cervical radicular pain C-spine imaging showed osteoarthritic changes at several levels, most noticeably at C5-6 and C6-7. Suspect severe disc narrowing is causing nerve compression and is the culprit for many of the symptoms in his left UE as well. Needs to re-establish with orthopedics for further management.  - Ambulatory referral to Orthopedic Surgery  2. Osteoarthritis of left shoulder, unspecified osteoarthritis type Reviewed that x-ray of left shoulder in Jan 2021 showed moderate generalized arthritic changes. Patient would likely benefit from intraarticular steroid injection. Ortho referral.  -  Ambulatory referral to Orthopedic Surgery  3. Anxiety state Will start Zoloft therapy for anxiety. Discussed potential side effects and need to take daily for best efficacy. Also discussed can take 4-6 weeks to see any change in symptoms. Consider counseling or appointment with Jenel Lucks, LCSW as well. Follow up in 2 months with new medication therapy.    - sertraline (ZOLOFT) 25 MG tablet; Take one tablet daily. If tolerating, increase to two tablets in one week.  Dispense: 60 tablet; Refill: 1     [name], D.O. 10/10/2020, 3:50 PM Primary Care at [facility]

## 2020-10-28 ENCOUNTER — Other Ambulatory Visit: Payer: Self-pay

## 2020-10-28 ENCOUNTER — Ambulatory Visit (INDEPENDENT_AMBULATORY_CARE_PROVIDER_SITE_OTHER): Payer: Medicaid Other | Admitting: Family Medicine

## 2020-10-28 ENCOUNTER — Encounter: Payer: Self-pay | Admitting: Family Medicine

## 2020-10-28 DIAGNOSIS — G8929 Other chronic pain: Secondary | ICD-10-CM | POA: Diagnosis not present

## 2020-10-28 DIAGNOSIS — M5010 Cervical disc disorder with radiculopathy, unspecified cervical region: Secondary | ICD-10-CM

## 2020-10-28 DIAGNOSIS — M25512 Pain in left shoulder: Secondary | ICD-10-CM

## 2020-10-28 MED ORDER — TRAMADOL HCL 50 MG PO TABS: 50.0000 mg | ORAL_TABLET | Freq: Four times a day (QID) | ORAL | 0 refills | Status: DC | PRN

## 2020-10-28 MED ORDER — NABUMETONE 750 MG PO TABS: 750.0000 mg | ORAL_TABLET | Freq: Two times a day (BID) | ORAL | 6 refills | Status: DC | PRN

## 2020-10-28 NOTE — Progress Notes (Signed)
   Office Visit Note   Patient: Kevin Stephenson           Date of Birth: 1975/10/31           MRN: [id number] Visit Date: 10/28/2020 Requested by: Arvilla Market, DO [address] [place],  [place] [address] PCP: Arvilla Market, DO  Subjective: Chief Complaint  Patient presents with  . Neck - Pain    Neck hurts all the time. Constant stiffness, decreased ROM.  Marland Kitchen Left Shoulder - Pain    Pain in the shoulder, with numbness. Some days he has weakness in the left hand - drops objects. Cannot raise the left arm all the way up.    HPI: He is here for follow-up chronic neck and left arm pain.  Last year I gave him prednisone but it did not help.  He continues to have daily pain radiating into his hand causing a numbness and tingling sensation in his hand.  He drops things frequently because he is not feeling them, and his arm feels weak.  His neck pops frequently and sometimes it feels like his legs are going to give out.  He had to stop work because of his pain.               ROS:   All other systems were reviewed and are negative.  Objective: Vital Signs: There were no vitals taken for this visit.  Physical Exam:  General:  Alert and oriented, in no acute distress. Pulm:  Breathing unlabored. Psy:  Normal mood, congruent affect.  Neck: He has equivocal Spurling's test.  He is tender to palpation to the left of C5-6.  He has good range of motion of the shoulder today with no palpable crepitus.  Upper extremity strength and reflexes are still normal except for intrinsic hand strength which is slightly weak.  Negative Tinel's at the carpal tunnel, negative Phalen's test.  Imaging: No results found.  Assessment & Plan: 1.  Chronic neck and left arm pain, suspect due to cervical radiculopathy.  He does have glenohumeral degenerative change, but I do not think it is the source of his pain. -Tramadol and Relafen as needed.  MRI of the cervical spine ordered.  MRI is  totally normal, then consider glenohumeral injection.     Procedures: No procedures performed        PMFS History: Patient Active Problem List   Diagnosis Date Noted  . Pain in joint of left shoulder 11/14/2019  . Cervical radicular pain 11/14/2019  . Elevated blood pressure reading 11/14/2019  . Vapes nicotine containing substance 11/14/2019   History reviewed. No pertinent past medical history.  Family History  Problem Relation Age of Onset  . Heart failure Father   . Colon cancer Father   . Heart disease Father     Past Surgical History:  Procedure Laterality Date  . APPENDECTOMY    . DENTAL SURGERY  08/2019   all teeth removed   Social History   Occupational History  . Occupation: [employer] doctor  Tobacco Use  . Smoking status: Former Smoker    Quit date: 2015    Years since quitting: 7.0  . Smokeless tobacco: Never Used  Vaping Use  . Vaping Use: Every day  Substance and Sexual Activity  . Alcohol use: No  . Drug use: No  . Sexual activity: Not on file

## 2020-11-05 ENCOUNTER — Other Ambulatory Visit: Payer: Self-pay | Admitting: Family Medicine

## 2020-11-05 ENCOUNTER — Telehealth: Payer: Self-pay | Admitting: Family Medicine

## 2020-11-05 DIAGNOSIS — M25512 Pain in left shoulder: Secondary | ICD-10-CM

## 2020-11-05 DIAGNOSIS — M5010 Cervical disc disorder with radiculopathy, unspecified cervical region: Secondary | ICD-10-CM

## 2020-11-05 DIAGNOSIS — G8929 Other chronic pain: Secondary | ICD-10-CM

## 2020-11-05 NOTE — Telephone Encounter (Signed)
Jacki Cones with [facility] called stating she needs a precertification in order to get the pt scheduled for an MRI and she states it has been in medical review for awhile. She would like a CB to get this as she is trying to schedule the pt for this Saturday.   Jacki Cones CB# [phone]

## 2020-11-05 NOTE — Telephone Encounter (Signed)
Sending to you to handle, please

## 2020-11-06 ENCOUNTER — Telehealth: Payer: Self-pay | Admitting: Family Medicine

## 2020-11-06 NOTE — Telephone Encounter (Signed)
I called the pharmacy, pt picked up the tramadol, but the nabumetone needed prior auth. I have submitted on cover my meds.  Pending approval

## 2020-11-06 NOTE — Telephone Encounter (Signed)
Pt states that he can't get his medication and it was prescribed on the 3rd. They said they faxed a pre auth form to the office. Can you give him a call please [phone]

## 2020-11-06 NOTE — Telephone Encounter (Signed)
I called and left a voice message - we have not received anything from his pharmacy. I will call the pharmacy and see if it is just one of the medications or both that need prior authorization vs changing.

## 2020-11-07 ENCOUNTER — Other Ambulatory Visit: Payer: Self-pay

## 2020-11-07 ENCOUNTER — Telehealth (INDEPENDENT_AMBULATORY_CARE_PROVIDER_SITE_OTHER): Payer: Medicaid Other | Admitting: Physician Assistant

## 2020-11-07 ENCOUNTER — Encounter: Payer: Self-pay | Admitting: Physician Assistant

## 2020-11-07 DIAGNOSIS — R12 Heartburn: Secondary | ICD-10-CM | POA: Diagnosis not present

## 2020-11-07 DIAGNOSIS — F411 Generalized anxiety disorder: Secondary | ICD-10-CM

## 2020-11-07 DIAGNOSIS — T50905A Adverse effect of unspecified drugs, medicaments and biological substances, initial encounter: Secondary | ICD-10-CM | POA: Diagnosis not present

## 2020-11-07 MED ORDER — OMEPRAZOLE 20 MG PO CPDR: 20.0000 mg | DELAYED_RELEASE_CAPSULE | Freq: Every day | ORAL | 0 refills | Status: DC

## 2020-11-07 NOTE — Telephone Encounter (Signed)
Approvedon January 12 Request Reference Number: [id number]. NABUMETONE TAB 750MG  is approved through 11/06/2021. For further questions, call 01/04/2022 at [phone].

## 2020-11-07 NOTE — Progress Notes (Unsigned)
Established Patient Office Visit  Subjective:  Patient ID: Kevin Stephenson, male    DOB: 1976/06/03  Age: 45 y.o. MRN: [id number]  CC:  Chief Complaint  Patient presents with  . Heartburn   Virtual Visit via Telephone Note  I connected with Kevin Stephenson on November 07, 2020 at  2:10 PM EST by telephone and verified that I am speaking with the correct person using two identifiers.  Location: Patient: Home Provider: Primary Care at [facility]   I discussed the limitations, risks, security and privacy concerns of performing an evaluation and management service by telephone and the availability of in person appointments. I also discussed with the patient that there may be a patient responsible charge related to this service. The patient expressed understanding and agreed to proceed.   History of Present Illness:  Kevin Stephenson reports that he was recenlty started on zoloft, states that he has been experiencing heartburn for 3 to 4 hours on a daily basis after taking it.  Started when he increased the dose 5 days ago to 50 mg, he states that he does feel that his symptoms did start improving, however does endorse yesterday he did have heartburn again.  Has tried taking it with or without food.  Is taking it at approximately 5 PM.  Does endorse that he feels it is offering great relief from his anxiety  Denies previous treatment for GERD, uncommon for him to have heartburn prior.  Observations/Objective: Medical history and current medications reviewed, no physical exam completed     History reviewed. No pertinent past medical history.  Past Surgical History:  Procedure Laterality Date  . APPENDECTOMY    . DENTAL SURGERY  08/2019   all teeth removed    Family History  Problem Relation Age of Onset  . Heart failure Father   . Colon cancer Father   . Heart disease Father     Social History   Socioeconomic History  . Marital status: Married    Spouse name: Not on file   . Number of children: 2  . Years of education: 70  . Highest education level: Not on file  Occupational History  . Occupation: [employer] doctor  Tobacco Use  . Smoking status: Former Smoker    Quit date: 2015    Years since quitting: 7.0  . Smokeless tobacco: Never Used  Vaping Use  . Vaping Use: Every day  Substance and Sexual Activity  . Alcohol use: No  . Drug use: No  . Sexual activity: Not on file  Other Topics Concern  . Not on file  Social History Narrative  . Not on file   Social Determinants of Health   Financial Resource Strain: Not on file  Food Insecurity: Not on file  Transportation Needs: Not on file  Physical Activity: Not on file  Stress: Not on file  Social Connections: Not on file  Intimate Partner Violence: Not on file    Outpatient Medications Prior to Visit  Medication Sig Dispense Refill  . nabumetone (RELAFEN) 750 MG tablet Take 1 tablet (750 mg total) by mouth 2 (two) times daily as needed. 60 tablet 6  . sertraline (ZOLOFT) 25 MG tablet Take one tablet daily. If tolerating, increase to two tablets in one week. 60 tablet 1  . traMADol (ULTRAM) 50 MG tablet Take 1 tablet (50 mg total) by mouth every 6 (six) hours as needed. 30 tablet 0   No facility-administered medications prior to visit.    No Known  Allergies  ROS Review of Systems  Constitutional: Negative.   HENT: Negative.   Eyes: Negative.   Respiratory: Negative for cough and chest tightness.   Cardiovascular: Negative.   Gastrointestinal: Positive for abdominal pain. Negative for diarrhea, nausea and vomiting.  Endocrine: Negative.   Genitourinary: Negative.   Musculoskeletal: Negative.   Skin: Negative.   Allergic/Immunologic: Negative.   Neurological: Negative.   Hematological: Negative.   Psychiatric/Behavioral: Negative.       Objective:     There were no vitals taken for this visit. Wt Readings from Last 3 Encounters:  10/10/20 (!) 325 lb (147.4 kg)  12/26/19 297 lb  6.4 oz (134.9 kg)  11/14/19 285 lb 9.6 oz (129.5 kg)     Health Maintenance Due  Topic Date Due  . Hepatitis C Screening  Never done  . COVID-19 Vaccine (1) Never done  . INFLUENZA VACCINE  Never done    There are no preventive care reminders to display for this patient.  No results found for: TSH No results found for: WBC, HGB, HCT, MCV, PLT Lab Results  Component Value Date   NA 142 12/26/2019   K 4.0 12/26/2019   CO2 24 12/26/2019   GLUCOSE 84 12/26/2019   BUN 16 12/26/2019   CREATININE 1.00 12/26/2019   CALCIUM 9.6 12/26/2019   No results found for: CHOL No results found for: HDL No results found for: LDLCALC No results found for: TRIG No results found for: CHOLHDL No results found for: BWGY6Z    Assessment & Plan:   Problem List Items Addressed This Visit   None   Visit Diagnoses    Heartburn    -  Primary   Relevant Medications   omeprazole (PRILOSEC) 20 MG capsule   Anxiety state       Medication adverse effect, initial encounter         Assessment and Plan:  1. Heartburn Trial Prilosec for 14 days. - omeprazole (PRILOSEC) 20 MG capsule; Take 1 capsule (20 mg total) by mouth daily.  Dispense: 14 capsule; Refill: 0  2. Anxiety state   3. Medication adverse effect, initial encounter Patient would like to resume medication due to the benefit of reduced anxiety.  Encouraged patient to move dosing to bedtime.  Encouraged patient to take Prilosec for the next 12 days and then stop for 2 days to see if heartburn adverse effect has resolved.  Patient to follow-up with mobile medicine unit in 2 weeks   Follow Up Instructions:    I discussed the assessment and treatment plan with the patient. The patient was provided an opportunity to ask questions and all were answered. The patient agreed with the plan and demonstrated an understanding of the instructions.   The patient was advised to call back or seek an in-person evaluation if the symptoms worsen or if  the condition fails to improve as anticipated.  I provided 21 minutes of non-face-to-face time during this encounter.   Meds ordered this encounter  Medications  . omeprazole (PRILOSEC) 20 MG capsule    Sig: Take 1 capsule (20 mg total) by mouth daily.    Dispense:  14 capsule    Refill:  0    Order Specific Question:   Supervising Provider    Answer:   Storm Frisk [[id number]]    Follow-up: Return in about 2 weeks (around 11/21/2020).    Kasandra Knudsen Lopaka Karge, PA-C

## 2020-11-07 NOTE — Telephone Encounter (Signed)
I called the patient and left a voice mail that he should now be able to pick up the nabumetone, as insurance did approve coverage.

## 2020-11-07 NOTE — Patient Instructions (Addendum)
I encourage you to move your dosing of Zoloft to bedtime.  Hopefully over the next 2 weeks the adverse effect that you are experiencing of heartburn will resolve.  In the meantime I encourage you to use Prilosec in the morning.  Now this may cover any heartburn symptoms that you are having, so near the end of the 2 weeks I want you to stop the Prilosec for a couple of days to see if your symptoms have resolved.  We will call you at the end of next week to schedule your follow-up appointment.  Please let us know if there is anything else we can do for you    Roney Jaffe, PA-C Physician Assistant [facility] [email or web address]   Omeprazole Tablets What is this medicine? OMEPRAZOLE (oh ME pray zol) prevents the production of acid in the stomach. It is used to treat the symptoms of heartburn. You can buy this medicine without a prescription. This product is not for long-term use, unless otherwise directed by your doctor or health care professional. This medicine may be used for other purposes; ask your health care provider or pharmacist if you have questions. COMMON BRAND NAME(S): Prilosec OTC What should I tell my health care provider before I take this medicine? They need to know if you have any of these conditions:  black or bloody stools  chest pain  difficulty swallowing  have had heartburn for over 3 months  have heartburn with dizziness, lightheadedness or sweating  liver disease  lupus  stomach pain  unexplained weight loss  vomiting with blood  wheezing  an unusual or allergic reaction to omeprazole, other medicines, foods, dyes, or preservatives  pregnant or trying to get pregnant  breast-feeding How should I use this medicine? Take this medicine by mouth with a glass of water. Follow the directions on the product label. Do not cut, crush or chew this medicine. Swallow the tablets whole. Take this  medicine on an empty stomach, at least 30 minutes before breakfast. Take your medicine at regular intervals. Do not take it more often than directed. Talk to your pediatrician regarding the use of this medicine in children. Special care may be needed. Overdosage: If you think you have taken too much of this medicine contact a poison control center or emergency room at once. NOTE: This medicine is only for you. Do not share this medicine with others. What if I miss a dose? If you miss a dose, take it as soon as you can. If it is almost time for your next dose, take only that dose. Do not take double or extra doses. What may interact with this medicine? Do not take this medicine with any of the following medications:  atazanavir  clopidogrel  nelfinavir  rilpivirine This medicine may also interact with the following medications:  antifungals like itraconazole, ketoconazole, and voriconazole  certain antivirals for HIV or hepatitis  certain medicines that treat or prevent blood clots like warfarin  cilostazol  citalopram  cyclosporine  dasatinib  digoxin  disulfiram  diuretics  erlotinib  iron supplements  medicines for anxiety, panic, and sleep like diazepam  medicines for seizures like carbamazepine, phenobarbital, phenytoin  methotrexate  mycophenolate mofetil  nilotinib  rifampin  St. John's wort  tacrolimus  vitamin B12 This list may not describe all possible interactions. Give your health care provider a list of all the medicines, herbs, non-prescription drugs, or dietary supplements you use. Also tell them if you smoke, drink alcohol, or  use illegal drugs. Some items may interact with your medicine. What should I watch for while using this medicine? It can take several days before your heartburn gets better. Tell your healthcare professional if your symptoms do not start to get better or if they get worse. If you need to take this medicine for more  than 14 days, talk to your healthcare professional. Heartburn may sometimes be caused by a more serious condition. This medicine may cause a decrease in vitamin B12. You should make sure that you get enough vitamin B12 while you are taking this medicine. Discuss the foods you eat and the vitamins you take with your health care professional. What side effects may I notice from receiving this medicine? Side effects that you should report to your doctor or health care professional as soon as possible:  allergic reactions like skin rash, itching or hives, swelling of the face, lips, or tongue  bone pain  breathing problems  fever or sore throat  joint pain  rash on cheeks or arms that gets worse in the sun  redness, blistering, peeling, or loosening of the skin, including inside the mouth  severe diarrhea  signs and symptoms of kidney injury like trouble passing urine or change in the amount of urine  signs and symptoms of low magnesium like muscle cramps; muscle pain; muscle weakness; tremors; seizures; or fast, irregular heartbeat  stomach polyps  unusual bleeding or bruising Side effects that usually do not require medical attention (report to your doctor or health care professional if they continue or are bothersome):  diarrhea  dry mouth  gas  headache  nausea  stomach pain This list may not describe all possible side effects. Call your doctor for medical advice about side effects. You may report side effects to FDA at 1-800-FDA-1088. Where should I keep my medicine? Keep out of the reach of children. Store at room temperature between 20 and 25 degrees C (68 and 77 degrees F). Protect from light and moisture. Throw away any unused medicine after the expiration date. NOTE: This sheet is a summary. It may not cover all possible information. If you have questions about this medicine, talk to your doctor, pharmacist, or health care provider.  2021 Elsevier/Gold Standard  (2020-08-21 18:47:00)

## 2020-11-07 NOTE — Progress Notes (Signed)
Patient verified DOB Patient complains of heart burn beginning after starting the medication zoloft. Patient shares when he takes medication he has heart burn for 3-4 hours. Patient has not used OTC medications. Patient takes medication after eating supper. Patient has attempted to take medication prior to, during and after eating and the effect is the same.

## 2020-11-08 DIAGNOSIS — F411 Generalized anxiety disorder: Secondary | ICD-10-CM | POA: Insufficient documentation

## 2020-11-12 ENCOUNTER — Ambulatory Visit: Payer: Medicaid Other

## 2020-11-14 ENCOUNTER — Ambulatory Visit: Payer: Medicaid Other | Attending: Internal Medicine

## 2020-11-14 DIAGNOSIS — Z23 Encounter for immunization: Secondary | ICD-10-CM

## 2020-11-14 NOTE — Progress Notes (Signed)
   Covid-19 Vaccination Clinic  Name:  Kevin Stephenson    MRN: [id number] DOB: 03-28-1976  11/14/2020  [name] was observed post Covid-19 immunization for 15 minutes without incident. He was provided with Vaccine Information Sheet and instruction to access the V-Safe system.   [name] was instructed to call 911 with any severe reactions post vaccine: Marland Kitchen Difficulty breathing  . Swelling of face and throat  . A fast heartbeat  . A bad rash all over body  . Dizziness and weakness   Immunizations Administered    Name Date Dose VIS Date Route   Pfizer COVID-19 Vaccine 11/14/2020  2:03 PM 0.3 mL 08/14/2020 Intramuscular   Manufacturer: [employer], [employer]   Lot: [id number]   NDC: [address]-5

## 2020-11-15 ENCOUNTER — Other Ambulatory Visit: Payer: Self-pay | Admitting: Internal Medicine

## 2020-11-15 MED ORDER — PANTOPRAZOLE SODIUM 40 MG PO TBEC: 40.0000 mg | DELAYED_RELEASE_TABLET | Freq: Every day | ORAL | 1 refills | Status: DC

## 2020-11-19 ENCOUNTER — Telehealth: Payer: Self-pay

## 2020-11-19 NOTE — Telephone Encounter (Signed)
Pt called asking if we have found anybody to take his insurance for PT.  Pt called asking for a doctors note due to the fact that pt has been out of work until we get his issue resolved.

## 2020-11-20 NOTE — Telephone Encounter (Signed)
Ok to give note oow until February 15.

## 2020-11-20 NOTE — Telephone Encounter (Signed)
I called the patient to let him know the [facility] [address] called him on 11/11/20 to schedule PT. I gave the patient their phone number [phone]) to call and get himself scheduled.   He has been out-of-work since 10/28/20 office visit. He is asking for a work note. Is this ok, and for how long should he remain oow?

## 2020-11-20 NOTE — Telephone Encounter (Signed)
I called and advised the patient of the work note - was sent through MyChart to him. The patient said he will send this unsigned copy to his employer for now, and let me know if he needs me to send the signed note somewhere.

## 2020-12-05 ENCOUNTER — Ambulatory Visit: Payer: Medicaid Other

## 2020-12-13 ENCOUNTER — Other Ambulatory Visit: Payer: Self-pay | Admitting: Internal Medicine

## 2020-12-13 ENCOUNTER — Other Ambulatory Visit: Payer: Self-pay | Admitting: Family Medicine

## 2020-12-13 DIAGNOSIS — F411 Generalized anxiety disorder: Secondary | ICD-10-CM

## 2020-12-15 ENCOUNTER — Other Ambulatory Visit: Payer: Self-pay | Admitting: Internal Medicine

## 2020-12-15 DIAGNOSIS — F411 Generalized anxiety disorder: Secondary | ICD-10-CM

## 2020-12-24 ENCOUNTER — Telehealth: Payer: Self-pay | Admitting: Internal Medicine

## 2020-12-24 ENCOUNTER — Other Ambulatory Visit: Payer: Self-pay

## 2020-12-24 ENCOUNTER — Ambulatory Visit: Payer: Medicaid Other | Admitting: Internal Medicine

## 2020-12-24 DIAGNOSIS — F411 Generalized anxiety disorder: Secondary | ICD-10-CM

## 2020-12-24 MED ORDER — SERTRALINE HCL 25 MG PO TABS: ORAL_TABLET | ORAL | 0 refills | Status: DC

## 2020-12-24 NOTE — Telephone Encounter (Signed)
1) Medication(s) Requested (by name): sertraline (ZOLOFT) 25 MG tablet  2) Pharmacy of Choice: [id number] - [place], [place] - [address] (Ph: [phone]) 3) Special Requests:   Approved medications will be sent to the pharmacy, we will reach out if there is an issue.  Requests made after 3pm may not be addressed until the following business day!  If a patient is unsure of the name of the medication(s) please note and ask patient to call back when they are able to provide all info, do not send to responsible party until all information is available!

## 2020-12-24 NOTE — Telephone Encounter (Signed)
Per chart review at last refill request pt was advised that he needs to schedule his follow-up. Pt was started on medication 09/2020 and was due to follow-up in Feb, pls contact pt and let him know this, get scheduled for a follow-up, virtual or in-person. Will send a 30-day supply to last until that appt but pt will not get any further refills without the appt.

## 2021-01-01 ENCOUNTER — Other Ambulatory Visit: Payer: Self-pay

## 2021-01-01 ENCOUNTER — Ambulatory Visit: Payer: Medicaid Other | Attending: Family Medicine

## 2021-01-01 DIAGNOSIS — M6281 Muscle weakness (generalized): Secondary | ICD-10-CM | POA: Insufficient documentation

## 2021-01-01 DIAGNOSIS — M25512 Pain in left shoulder: Secondary | ICD-10-CM | POA: Insufficient documentation

## 2021-01-01 DIAGNOSIS — M5010 Cervical disc disorder with radiculopathy, unspecified cervical region: Secondary | ICD-10-CM | POA: Insufficient documentation

## 2021-01-01 DIAGNOSIS — R29898 Other symptoms and signs involving the musculoskeletal system: Secondary | ICD-10-CM | POA: Insufficient documentation

## 2021-01-01 DIAGNOSIS — G8929 Other chronic pain: Secondary | ICD-10-CM | POA: Diagnosis present

## 2021-01-02 NOTE — Therapy (Signed)
[facility] [address] [place], [place], [address] Phone: [phone]   Fax:  [phone]  Physical Therapy Evaluation  Patient Details  Name: Kevin Stephenson MRN: [id number] Date of Birth: Sep 13, 1976 Referring Provider (PT): Lavada Mesi, MD   Encounter Date: 01/01/2021   PT End of Session - 01/02/21 2158    Visit Number 1    Number of Visits 13    Date for PT Re-Evaluation 02/22/21    Authorization Type [place] MEDICAID [employer]    Progress Note Due on Visit 10    PT Start Time 1710    PT Stop Time 1750    PT Time Calculation (min) 40 min    Activity Tolerance Patient tolerated treatment well    Behavior During Therapy [facility] for tasks assessed/performed           No past medical history on file.  Past Surgical History:  Procedure Laterality Date  . APPENDECTOMY    . DENTAL SURGERY  08/2019   all teeth removed    There were no vitals filed for this visit.    Subjective Assessment - 01/01/21 1723    Subjective Pt reports 2 years of cervical and L shoulder UE pain that has progressively wrosened over time. 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 Pt reports she has worked since covid and does not feel he is able to work in this field with his neck and Lshoulder bothering him as they are.    How long can you sit comfortably? 5 mins    How long can you stand comfortably? 1 hour    How long can you walk comfortably? Not an issue    Diagnostic tests 11/13/20: cervical Xray-FINDINGS:  Frontal, lateral, open-mouth odontoid, and bilateral oblique views  were obtained. There is no fracture or spondylolisthesis.  Prevertebral soft tissues and predental space regions are normal.  There is severe disc space narrowing at C5-6 and C6-7. There is  moderate disc space narrowing at C4-5. There is facet hypertrophy  with exit foraminal narrowing at C4-5, C5-6, and C6-7 bilaterally.  There  are foci of calcification in each carotid artery. Lung apices  are clear. There is elongation of the C7 transverse processes  bilaterally with a rudimentary cervical rib on the right.     IMPRESSION:  Osteoarthritic change at several levels, most notably at C5-6 and  C6-7. No fracture or spondylolisthesis. 11/13/20: L shoulder- FINDINGS:  Frontal, oblique, Y scapular, and axillary images were obtained. No  fracture or dislocation. There is moderate generalized joint space  narrowing. No erosive change or intra-articular calcification.  Visualized left lung clear.     IMPRESSION:  Moderate generalized osteoarthritic change. No erosive change. No  fracture or dislocation.    Patient Stated Goals To get out of pain    Currently in Pain? Yes    Pain Score 7    4-10/10   Pain Location Neck    Pain Orientation Left;Posterior;Mid;Lower    Pain Descriptors / Indicators Throbbing;Aching;Sharp   popping   Pain Type Chronic pain    Pain Radiating Towards L arm    Pain Onset More than a month ago    Pain Frequency Constant    Aggravating Factors  moving, head not being supported    Pain Relieving Factors lyiing down in a certain position    Effect of Pain on Daily Activities Significant  [facility] PT Assessment - 01/02/21 0001      Assessment   Medical Diagnosis Cervical disc disorder with radiculopathy, unspecified cervical region; Chronic left shoulder pain    Referring Provider (PT) Hilts, Michael, MD    Onset Date/Surgical Date --   Approx 2 years ago   Hand Dominance Right    Prior Therapy No      Precautions   Precautions None      Restrictions   Weight Bearing Restrictions No      Balance Screen   Has the patient fallen in the past 6 months No      Home Environment   Living Environment Private residence    Living Arrangements Spouse/significant other;Children    Type of Home House    Home Access Stairs to enter    Entrance Stairs-Number of Steps 7    Entrance  Stairs-Rails Right;Left    Home Layout One level      Prior Function   Level of Independence Independent    Vocation Unemployed      Cognition   Overall Cognitive Status Within Functional Limits for tasks assessed      Observation/Other Assessments   Focus on Therapeutic Outcomes (FOTO)  NA-MCD      Sensation   Light Touch Appears Intact   pt reports numbness of the palm of the L hand     Posture/Postural Control   Posture/Postural Control Postural limitations    Postural Limitations Forward head;Rounded Shoulders;Increased thoracic kyphosis   CT step off     Deep Tendon Reflexes   DTR Assessment Site Biceps;Brachioradialis;Triceps    Biceps DTR 2+    Brachioradialis DTR 2+    Triceps DTR 2+      ROM / Strength   AROM / PROM / Strength Strength;AROM      AROM   AROM Assessment Site Cervical    Cervical Flexion 20d, pain L cervical and CT juction    Cervical Extension 25d, pain L cervical and CT juction    Cervical - Right Side Bend 18d, pain L cervical and CT juction    Cervical - Left Side Bend 10d, pain L cervical and CT juction    Cervical - Right Rotation 34d, pain L cervical and CT juction    Cervical - Left Rotation 32d, pain L cervical and CT juction      Strength   Overall Strength Comments R UE grossly 4+,5/5, L UE grossly 4-,4/5 c give way to MMT      Palpation   Palpation comment TTP to the L cervical paraspinals and the CT junction      Transfers   Transfers Sit to Stand;Stand to Sit    Sit to Stand 7: Independent      Ambulation/Gait   Ambulation/Gait Yes    Ambulation/Gait Assistance 7: Independent                      Objective measurements completed on examination: See above findings.               PT Education - 01/02/21 2216    Education Details Eval findings, POC, HEP, sleeping positions and support for comfort    Person(s) Educated Patient    Methods Explanation;Demonstration;Tactile cues;Verbal cues;Handout     Comprehension Verbalized understanding;Returned demonstration;Verbal cues required;Tactile cues required            PT Short Term Goals - 01/02/21 2244      PT SHORT TERM GOAL #  1   Title Pt will be Ind in an initial HEP    Baseline Started on eval    Status New    Target Date 01/23/21      PT SHORT TERM GOAL #2   Title Pt will voice understanding of measures to reduce or manage cevical and L shoulder pain    Status New    Target Date 01/23/21             PT Long Term Goals - 01/02/21 2253      PT LONG TERM GOAL #1   Title Increase cervical ROM's by 10d for improved cervical function    Baseline see flowsheets    Status New    Target Date 02/22/21      PT LONG TERM GOAL #2   Title Pt will be able to demostrate proper sitting posture to assist with the reductin of cervical pain    Status New    Target Date 02/22/21      PT LONG TERM GOAL #3   Title Increase L UE strength to 4,4+/5 fo improve functional use of the L UE    Baseline L UE strength = 4-,4/5    Status New    Target Date 02/22/21      PT LONG TERM GOAL #4   Title Pt will report a decrease in cervical pain to 5/10 or less with daily activities.    Baseline 4-10/10    Status New    Target Date 02/22/21      PT LONG TERM GOAL #5   Title pt will be Ind in a HEP to maintain or progress achieved level of function    Period Days    Status New    Target Date 02/22/21                  Plan - 01/02/21 1521    Clinical Impression Statement Pt presents with a chronic Hx of cervical and L shoulder pain. Pt requested his neck be assessed prior to his shoulder. Cervical ROM is markedly decreased for all motions, L UE weakness and numbness appears more general than specific to a myotome or dermatome. Due to the extended time frame of high level of pain, anticipate a slow and or a limited course of improvement. Pt will benefit from PT 2w6 to improve posture, increase ROM, and to reduce pain to optimize  cervical and L shoulder function.    Personal Factors and Comorbidities Time since onset of injury/illness/exacerbation    Examination-Activity Limitations Carry;Lift    Examination-Participation Restrictions Driving    Stability/Clinical Decision Making Evolving/Moderate complexity    Clinical Decision Making Moderate    Rehab Potential Fair    PT Frequency 2x / week    PT Duration 6 weeks    PT Treatment/Interventions ADLs/Self Care Home Management;Cryotherapy;Electrical Stimulation;Iontophoresis 4mg /ml Dexamethasone;Moist Heat;Traction;Therapeutic exercise;Therapeutic activities;Patient/family education;Manual techniques;Passive range of motion;Taping;Spinal Manipulations;Joint Manipulations    PT Next Visit Plan Assess response to cervical retractions, Ther ex for posture and ROM, Use of modalities and manual techniques as indicated.           Patient will benefit from skilled therapeutic intervention in order to improve the following deficits and impairments:  Decreased range of motion,Increased muscle spasms,Decreased activity tolerance,Pain,Decreased strength  Visit Diagnosis: Chronic left shoulder pain  Cervical disc disorder with radiculopathy, unspecified cervical region  Decreased ROM of neck  Muscle weakness (generalized)     Problem List Patient Active Problem List  Diagnosis Date Noted  . Anxiety state 11/08/2020  . Pain in joint of left shoulder 11/14/2019  . Cervical radicular pain 11/14/2019  . Elevated blood pressure reading 11/14/2019  . Vapes nicotine containing substance 11/14/2019    Joellyn RuedAllen Lurline Caver MS, PT 01/02/21 11:19 PM  [facility] [address] [place], [place], [address] Phone: [phone]   Fax:  [phone]  Name: Kevin Stephenson MRN: [id number] Date of Birth: 1976/09/13   Check all possible CPT codes: 97110- Therapeutic Exercise, 97140 - Manual Therapy, 97530 - Therapeutic Activities,  97535 - Self Care, [phone] [id number] - Mechanical traction, 97014 - Electrical stimulation (unattended), [id number] - Electrical stimulation (Manual), [id number] - Iontophoresis, [id number] - Ultrasound and [id number] - Vaso

## 2021-01-15 ENCOUNTER — Other Ambulatory Visit: Payer: Self-pay

## 2021-01-15 ENCOUNTER — Encounter: Payer: Self-pay | Admitting: Physical Therapy

## 2021-01-15 ENCOUNTER — Ambulatory Visit: Payer: Medicaid Other | Admitting: Physical Therapy

## 2021-01-15 DIAGNOSIS — M6281 Muscle weakness (generalized): Secondary | ICD-10-CM

## 2021-01-15 DIAGNOSIS — M25512 Pain in left shoulder: Secondary | ICD-10-CM | POA: Diagnosis not present

## 2021-01-15 DIAGNOSIS — R29898 Other symptoms and signs involving the musculoskeletal system: Secondary | ICD-10-CM

## 2021-01-15 DIAGNOSIS — M5010 Cervical disc disorder with radiculopathy, unspecified cervical region: Secondary | ICD-10-CM

## 2021-01-15 DIAGNOSIS — G8929 Other chronic pain: Secondary | ICD-10-CM

## 2021-01-15 NOTE — Therapy (Signed)
[facility] Outpatient Rehabilitation [facility] [address] [place], [place], [address] Phone: [phone]   Fax:  [phone]  Physical Therapy Treatment  Patient Details  Name: Kevin Stephenson MRN: [id number] Date of Birth: [birth year] Referring Provider (PT): Lavada Mesi, MD   Encounter Date: 01/15/2021   PT End of Session - 01/15/21 1547    Visit Number 2    Number of Visits 13    Date for PT Re-Evaluation 02/22/21    Authorization Type [place] MEDICAID [employer]    Progress Note Due on Visit 10    PT Start Time 1548    PT Stop Time 1626    PT Time Calculation (min) 38 min    Activity Tolerance Patient tolerated treatment well    Behavior During Therapy [facility] for tasks assessed/performed           History reviewed. No pertinent past medical history.  Past Surgical History:  Procedure Laterality Date  . APPENDECTOMY    . DENTAL SURGERY  08/2019   all teeth removed    There were no vitals filed for this visit.   Subjective Assessment - 01/15/21 1551    Subjective "I am still feeling in my hand and neck, I think alot of it is coming from the neck."    Diagnostic tests 11/13/20: cervical Xray-FINDINGS:  Frontal, lateral, open-mouth odontoid, and bilateral oblique views  were obtained. There is no fracture or spondylolisthesis.  Prevertebral soft tissues and predental space regions are normal.  There is severe disc space narrowing at C5-6 and C6-7. There is  moderate disc space narrowing at C4-5. There is facet hypertrophy  with exit foraminal narrowing at C4-5, C5-6, and C6-7 bilaterally.  There are foci of calcification in each carotid artery. Lung apices  are clear. There is elongation of the C7 transverse processes  bilaterally with a rudimentary cervical rib on the right.     IMPRESSION:  Osteoarthritic change at several levels, most notably at C5-6 and  C6-7. No fracture or spondylolisthesis. 11/13/20: L shoulder- FINDINGS:  Frontal, oblique,  Y scapular, and axillary images were obtained. No  fracture or dislocation. There is moderate generalized joint space  narrowing. No erosive change or intra-articular calcification.  Visualized left lung clear.     IMPRESSION:  Moderate generalized osteoarthritic change. No erosive change. No  fracture or dislocation.    Currently in Pain? Yes    Pain Score 8     Pain Location Neck    Pain Orientation Left    Pain Descriptors / Indicators Aching    Pain Type Chronic pain    Pain Onset More than a month ago    Pain Frequency Intermittent    Aggravating Factors  head movement's but it depends.    Pain Relieving Factors laying down              [facility] PT Assessment - 01/15/21 0001      Assessment   Medical Diagnosis Cervical disc disorder with radiculopathy, unspecified cervical region; Chronic left shoulder pain    Referring Provider (PT) Lavada Mesi, MD                         [facility] Adult PT Treatment/Exercise - 01/15/21 0001      Exercises   Exercises Neck      Neck Exercises: Supine   Neck Retraction 5 reps;5 secs      Modalities   Modalities Traction  Traction   Type of Traction Cervical    Min (lbs) 7    Max (lbs) 12    Hold Time 60    Rest Time 20    Time 10      Manual Therapy   Manual Therapy Manual Traction;Joint mobilization    Manual therapy comments MTPR along bil upper trap/ levator scapule, sub-occipitals    Joint Mobilization C3-C7 grade II PA and bil unilateral mobs    Manual Traction x 2 min gentle                    PT Short Term Goals - 01/02/21 2244      PT SHORT TERM GOAL #1   Title Pt will be Ind in an initial HEP    Baseline Started on eval    Status New    Target Date 01/23/21      PT SHORT TERM GOAL #2   Title Pt will voice understanding of measures to reduce or manage cevical and L shoulder pain    Status New    Target Date 01/23/21             PT Long Term Goals - 01/02/21 2253      PT LONG  TERM GOAL #1   Title Increase cervical ROM's by 10d for improved cervical function    Baseline see flowsheets    Status New    Target Date 02/22/21      PT LONG TERM GOAL #2   Title Pt will be able to demostrate proper sitting posture to assist with the reductin of cervical pain    Status New    Target Date 02/22/21      PT LONG TERM GOAL #3   Title Increase L UE strength to 4,4+/5 fo improve functional use of the L UE    Baseline L UE strength = 4-,4/5    Status New    Target Date 02/22/21      PT LONG TERM GOAL #4   Title Pt will report a decrease in cervical pain to 5/10 or less with daily activities.    Baseline 4-10/10    Status New    Target Date 02/22/21      PT LONG TERM GOAL #5   Title pt will be Ind in a HEP to maintain or progress achieved level of function    Period Days    Status New    Target Date 02/22/21                 Plan - 01/15/21 1612    Clinical Impression Statement pt reported no changes since the evaluation and has been compliant with his HEP. due to elevated pain focused on manual techniques to reduce upper trap and posterior cervical musculature, followed with grade II-III cervical mobst to promote mobility and reduce pain. trialed mechanic traction which he noted no major changes in pain.    PT Treatment/Interventions ADLs/Self Care Home Management;Cryotherapy;Electrical Stimulation;Iontophoresis 4mg /ml Dexamethasone;Moist Heat;Traction;Therapeutic exercise;Therapeutic activities;Patient/family education;Manual techniques;Passive range of motion;Taping;Spinal Manipulations;Joint Manipulations    PT Next Visit Plan Assess response to cervical retractions, Ther ex for posture and ROM, Use of modalities and manual techniques as indicated.    Consulted and Agree with Plan of Care Patient           Patient will benefit from skilled therapeutic intervention in order to improve the following deficits and impairments:  Decreased range of  motion,Increased muscle spasms,Decreased activity tolerance,Pain,Decreased strength  Visit Diagnosis: Chronic left shoulder pain  Cervical disc disorder with radiculopathy, unspecified cervical region  Decreased ROM of neck  Muscle weakness (generalized)     Problem List Patient Active Problem List   Diagnosis Date Noted  . Anxiety state 11/08/2020  . Pain in joint of left shoulder 11/14/2019  . Cervical radicular pain 11/14/2019  . Elevated blood pressure reading 11/14/2019  . Vapes nicotine containing substance 11/14/2019   Lulu Riding PT, DPT, LAT, ATC  01/15/21  4:23 PM      [facility] Outpatient Rehabilitation [facility] [address] [place], [place], [address] Phone: [phone]   Fax:  [phone]  Name: Kevin Stephenson MRN: [id number] Date of Birth: [birth year]

## 2021-01-17 ENCOUNTER — Ambulatory Visit: Payer: Medicaid Other

## 2021-01-17 ENCOUNTER — Other Ambulatory Visit: Payer: Self-pay | Admitting: Family Medicine

## 2021-01-17 ENCOUNTER — Other Ambulatory Visit: Payer: Self-pay | Admitting: Internal Medicine

## 2021-01-17 DIAGNOSIS — F411 Generalized anxiety disorder: Secondary | ICD-10-CM

## 2021-01-20 NOTE — Telephone Encounter (Signed)
Please advise on anxiety med refill.

## 2021-01-21 ENCOUNTER — Other Ambulatory Visit: Payer: Self-pay

## 2021-01-21 ENCOUNTER — Ambulatory Visit: Payer: Medicaid Other

## 2021-01-21 DIAGNOSIS — M6281 Muscle weakness (generalized): Secondary | ICD-10-CM

## 2021-01-21 DIAGNOSIS — G8929 Other chronic pain: Secondary | ICD-10-CM

## 2021-01-21 DIAGNOSIS — M5010 Cervical disc disorder with radiculopathy, unspecified cervical region: Secondary | ICD-10-CM

## 2021-01-21 DIAGNOSIS — R29898 Other symptoms and signs involving the musculoskeletal system: Secondary | ICD-10-CM

## 2021-01-21 DIAGNOSIS — M25512 Pain in left shoulder: Secondary | ICD-10-CM

## 2021-01-21 NOTE — Therapy (Addendum)
[place], [place], [address] Phone: [phone]   Fax:  [phone]  Physical Therapy Treatment/Discharge   Patient Details  Name: Kevin Stephenson MRN: [id number] Date of Birth: [birth year] Referring Provider (PT): Eunice Blase, MD   Encounter Date: 01/21/2021   PT End of Session - 01/21/21 1613    Visit Number 3    Number of Visits 13    Date for PT Re-Evaluation 02/22/21    Authorization Type [place] MEDICAID [employer]    Progress Note Due on Visit 10    PT Start Time 1546    PT Stop Time 1625    PT Time Calculation (min) 39 min    Activity Tolerance Patient tolerated treatment well;Patient limited by pain    Behavior During Therapy [facility] for tasks assessed/performed           History reviewed. No pertinent past medical history.  Past Surgical History:  Procedure Laterality Date  . APPENDECTOMY    . DENTAL SURGERY  08/2019   all teeth removed    There were no vitals filed for this visit.   Subjective Assessment - 01/21/21 1549    Subjective patient reports his neck feels like crap. He reports he did a lot of things at last session and it made it worse. Patient reports he is working on his exercises.    Diagnostic tests 11/13/20: cervical Xray-FINDINGS:  Frontal, lateral, open-mouth odontoid, and bilateral oblique views  were obtained. There is no fracture or spondylolisthesis.  Prevertebral soft tissues and predental space regions are normal.  There is severe disc space narrowing at C5-6 and C6-7. There is  moderate disc space narrowing at C4-5. There is facet hypertrophy  with exit foraminal narrowing at C4-5, C5-6, and C6-7 bilaterally.  There are foci of calcification in each carotid artery. Lung apices  are clear. There is elongation of the C7 transverse processes  bilaterally with a rudimentary cervical rib on the right.     IMPRESSION:  Osteoarthritic change at several levels, most  notably at C5-6 and  C6-7. No fracture or spondylolisthesis. 11/13/20: L shoulder- FINDINGS:  Frontal, oblique, Y scapular, and axillary images were obtained. No  fracture or dislocation. There is moderate generalized joint space  narrowing. No erosive change or intra-articular calcification.  Visualized left lung clear.     IMPRESSION:  Moderate generalized osteoarthritic change. No erosive change. No  fracture or dislocation.    Currently in Pain? Yes    Pain Score 7     Pain Location Neck    Pain Orientation Posterior    Pain Descriptors / Indicators Sore   "feels like someone is twisting my neck."   Pain Type Chronic pain    Pain Radiating Towards LUE    Pain Onset More than a month ago                             [facility] Adult PT Treatment/Exercise - 01/21/21 0001      Neck Exercises: Seated   Neck Retraction 10 reps    Neck Retraction Limitations 1 x 10 with overpressure, attempted retraction with extension    Other Seated Exercise scapular retraction 2 x 10    Other Seated Exercise cervical extension and rotation SNAG attempted pain; bilateral shoulder ER red theraband 2 x 10                  PT Education -  01/21/21 1627    Education Details Updated HEP. Posture education. Centralization of pain.    Person(s) Educated Patient    Methods Explanation;Demonstration;Tactile cues;Verbal cues;Handout    Comprehension Verbalized understanding;Returned demonstration;Verbal cues required;Tactile cues required            PT Short Term Goals - 01/02/21 2244      PT SHORT TERM GOAL #1   Title Pt will be Ind in an initial HEP    Baseline Started on eval    Status New    Target Date 01/23/21      PT SHORT TERM GOAL #2   Title Pt will voice understanding of measures to reduce or manage cevical and L shoulder pain    Status New    Target Date 01/23/21             PT Long Term Goals - 01/02/21 2253      PT LONG TERM GOAL #1   Title Increase cervical  ROM's by 10d for improved cervical function    Baseline see flowsheets    Status New    Target Date 02/22/21      PT LONG TERM GOAL #2   Title Pt will be able to demostrate proper sitting posture to assist with the reductin of cervical pain    Status New    Target Date 02/22/21      PT LONG TERM GOAL #3   Title Increase L UE strength to 4,4+/5 fo improve functional use of the L UE    Baseline L UE strength = 4-,4/5    Status New    Target Date 02/22/21      PT LONG TERM GOAL #4   Title Pt will report a decrease in cervical pain to 5/10 or less with daily activities.    Baseline 4-10/10    Status New    Target Date 02/22/21      PT LONG TERM GOAL #5   Title pt will be Ind in a HEP to maintain or progress achieved level of function    Period Days    Status New    Target Date 02/22/21                 Plan - 01/21/21 1621    Clinical Impression Statement Patient continues to report that his pain remains unchanged since evaluation. He reports he felt worse after last session, so other techniques were performed to assist in pain reduction. Attempted repeated cervical retraction and repeated cervical retraction with overpressure with slight centralization of pain to midline of C-spine. Attempted cervical retraction with extension, but patient reported an immediate increase in pain and Lt hand numbness. Attempted cervical SNAGS to address mobility deficits, but due to pain these movements were discontinued. Began postural correctives without increased pain, though pain levels remained high at end of session. Will continue to progress as tolerated, though if pain levels remain signfiicantly high with inability to progress in PT patient will be referred back to referring provider for further assessment.    PT Treatment/Interventions ADLs/Self Care Home Management;Cryotherapy;Electrical Stimulation;Iontophoresis 85m/ml Dexamethasone;Moist Heat;Traction;Therapeutic exercise;Therapeutic  activities;Patient/family education;Manual techniques;Passive range of motion;Taping;Spinal Manipulations;Joint Manipulations    PT Next Visit Plan Assess response to cervical retractions, Ther ex for posture and ROM, Use of modalities and manual techniques as indicated.    Consulted and Agree with Plan of Care Patient           Patient will benefit from skilled therapeutic intervention in order to  improve the following deficits and impairments:  Decreased range of motion,Increased muscle spasms,Decreased activity tolerance,Pain,Decreased strength  Visit Diagnosis: Chronic left shoulder pain  Cervical disc disorder with radiculopathy, unspecified cervical region  Decreased ROM of neck  Muscle weakness (generalized)     Problem List Patient Active Problem List   Diagnosis Date Noted  . Anxiety state 11/08/2020  . Pain in joint of left shoulder 11/14/2019  . Cervical radicular pain 11/14/2019  . Elevated blood pressure reading 11/14/2019  . Vapes nicotine containing substance 11/14/2019   Gwendolyn Grant, PT, DPT, ATC 01/21/21 4:40 PM PHYSICAL THERAPY DISCHARGE SUMMARY  Visits from Start of Care: 3  Current functional level related to goals / functional outcomes: No formal re-assessment of goals.    Remaining deficits: Patient reports no improvements in pain/function since start of care.    Education / Equipment: Spoke with patient on the phone as he cancelled his last two appointments due to high pain levels. He reports overall his pain has worsened since the start of care especially 1-2 days after PT sessions. Due to worsening pain and lack of overall progress it was recommended that patient be discharged at this time and follow-up with referring provider. Patient in agreement to this plan and has plans to reach out to physician later today.   Plan: Patient agrees to discharge.  Patient goals were not met. Patient is being discharged due to lack of progress.  ?????          Gwendolyn Grant, PT, DPT, ATC 01/28/21 4:03 PM  [facility] [address] [place], [place], [address] Phone: [phone]   Fax:  [phone]  Name: Saint Hank MRN: [id number] Date of Birth: 1976/08/30

## 2021-01-23 ENCOUNTER — Other Ambulatory Visit: Payer: Self-pay

## 2021-01-23 ENCOUNTER — Telehealth (INDEPENDENT_AMBULATORY_CARE_PROVIDER_SITE_OTHER): Payer: Medicaid Other | Admitting: Nurse Practitioner

## 2021-01-23 ENCOUNTER — Ambulatory Visit: Payer: Medicaid Other

## 2021-01-23 DIAGNOSIS — F411 Generalized anxiety disorder: Secondary | ICD-10-CM | POA: Diagnosis not present

## 2021-01-23 MED ORDER — SERTRALINE HCL 25 MG PO TABS: ORAL_TABLET | ORAL | 3 refills | Status: DC

## 2021-01-23 NOTE — Progress Notes (Signed)
Virtual Visit via Telephone Note  I connected with Kevin Stephenson on 01/23/21 at  8:00 AM EDT by telephone and verified that I am speaking with the correct person using two identifiers.  Location: Patient: home Provider: office   I discussed the limitations, risks, security and privacy concerns of performing an evaluation and management service by telephone and the availability of in person appointments. I also discussed with the patient that there may be a patient responsible charge related to this service. The patient expressed understanding and agreed to proceed.   History of Present Illness:  Patient presents today for follow-up on anxiety.  He was seen by Dr. Earlene Plater on 10/10/2020 and was prescribed Zoloft.  He was started at 1 tablet daily and was told he can increase to 2 tablets after 1 week.  Patient states that he has been taking medication as directed.  He states that he is much improved and is happy with his current dose of Zoloft.  He also has issues with ongoing neck pain and shoulder pain.  He has been following with orthopedics and has been following with physical therapy. Denies f/c/s, n/v/d, hemoptysis, PND, chest pain or edema.      Observations/Objective:  Vitals with BMI 10/10/2020 12/26/2019 11/14/2019  Height - - -  Weight 325 lbs 297 lbs 6 oz -  BMI - - -  Systolic 148 133 875  Diastolic 98 90 94  Pulse 70 61 -    Assessment and Plan:  Anxiety state:  Continue Zoloft therapy for anxiety. Working well for patient. Discussed potential side effects and need to take daily for best efficacy.   - sertraline (ZOLOFT) 25 MG tablet; Take two tablets daily.    Follow up:  Follow up with Dr. Earlene Plater in 3 months     I discussed the assessment and treatment plan with the patient. The patient was provided an opportunity to ask questions and all were answered. The patient agreed with the plan and demonstrated an understanding of the instructions.   The patient was  advised to call back or seek an in-person evaluation if the symptoms worsen or if the condition fails to improve as anticipated.  I provided 23 minutes of non-face-to-face time during this encounter.   Ivonne Andrew, NP

## 2021-01-23 NOTE — Patient Instructions (Signed)
Anxiety state:  Continue Zoloft therapy for anxiety. Working well for patient. Discussed potential side effects and need to take daily for best efficacy.   - sertraline (ZOLOFT) 25 MG tablet; Take two tablets daily.     http://NIMH.NIH.Gov">  Generalized Anxiety Disorder, Adult Generalized anxiety disorder (GAD) is a mental health condition. Unlike normal worries, anxiety related to GAD is not triggered by a specific event. These worries do not fade or get better with time. GAD interferes with relationships, work, and school. GAD symptoms can vary from mild to severe. People with severe GAD can have intense waves of anxiety with physical symptoms that are similar to panic attacks. What are the causes? The exact cause of GAD is not known, but the following are believed to have an impact:  Differences in natural brain chemicals.  Genes passed down from parents to children.  Differences in the way threats are perceived.  Development during childhood.  Personality. What increases the risk? The following factors may make you more likely to develop this condition:  Being male.  Having a family history of anxiety disorders.  Being very shy.  Experiencing very stressful life events, such as the death of a loved one.  Having a very stressful family environment. What are the signs or symptoms? People with GAD often worry excessively about many things in their lives, such as their health and family. Symptoms may also include:  Mental and emotional symptoms: ? Worrying excessively about natural disasters. ? Fear of being late. ? Difficulty concentrating. ? Fears that others are judging your performance.  Physical symptoms: ? Fatigue. ? Headaches, muscle tension, muscle twitches, trembling, or feeling shaky. ? Feeling like your heart is pounding or beating very fast. ? Feeling out of breath or like you cannot take a deep breath. ? Having trouble falling asleep or staying  asleep, or experiencing restlessness. ? Sweating. ? Nausea, diarrhea, or irritable bowel syndrome (IBS).  Behavioral symptoms: ? Experiencing erratic moods or irritability. ? Avoidance of new situations. ? Avoidance of people. ? Extreme difficulty making decisions. How is this diagnosed? This condition is diagnosed based on your symptoms and medical history. You will also have a physical exam. Your health care provider may perform tests to rule out other possible causes of your symptoms. To be diagnosed with GAD, a person must have anxiety that:  Is out of his or her control.  Affects several different aspects of his or her life, such as work and relationships.  Causes distress that makes him or her unable to take part in normal activities.  Includes at least three symptoms of GAD, such as restlessness, fatigue, trouble concentrating, irritability, muscle tension, or sleep problems. Before your health care provider can confirm a diagnosis of GAD, these symptoms must be present more days than they are not, and they must last for 6 months or longer. How is this treated? This condition may be treated with:  Medicine. Antidepressant medicine is usually prescribed for long-term daily control. Anti-anxiety medicines may be added in severe cases, especially when panic attacks occur.  Talk therapy (psychotherapy). Certain types of talk therapy can be helpful in treating GAD by providing support, education, and guidance. Options include: ? Cognitive behavioral therapy (CBT). People learn coping skills and self-calming techniques to ease their physical symptoms. They learn to identify unrealistic thoughts and behaviors and to replace them with more appropriate thoughts and behaviors. ? Acceptance and commitment therapy (ACT). This treatment teaches people how to be mindful as a way  to cope with unwanted thoughts and feelings. ? Biofeedback. This process trains you to manage your body's response  (physiological response) through breathing techniques and relaxation methods. You will work with a therapist while machines are used to monitor your physical symptoms.  Stress management techniques. These include yoga, meditation, and exercise. A mental health specialist can help determine which treatment is best for you. Some people see improvement with one type of therapy. However, other people require a combination of therapies.   Follow these instructions at home: Lifestyle  Maintain a consistent routine and schedule.  Anticipate stressful situations. Create a plan, and allow extra time to work with your plan.  Practice stress management or self-calming techniques that you have learned from your therapist or your health care provider. General instructions  Take over-the-counter and prescription medicines only as told by your health care provider.  Understand that you are likely to have setbacks. Accept this and be kind to yourself as you persist to take better care of yourself.  Recognize and accept your accomplishments, even if you judge them as small.  Keep all follow-up visits as told by your health care provider. This is important. Contact a health care provider if:  Your symptoms do not get better.  Your symptoms get worse.  You have signs of depression, such as: ? A persistently sad or irritable mood. ? Loss of enjoyment in activities that used to bring you joy. ? Change in weight or eating. ? Changes in sleeping habits. ? Avoiding friends or family members. ? Loss of energy for normal tasks. ? Feelings of guilt or worthlessness. Get help right away if:  You have serious thoughts about hurting yourself or others. If you ever feel like you may hurt yourself or others, or have thoughts about taking your own life, get help right away. Go to your nearest emergency department or:  Call your local emergency services (911 in the [place]  Call a suicide crisis helpline,  such as the National Suicide Prevention Lifeline at 340-112-2092. This is open 24 hours a day in the [place]  Text the Crisis Text Line at 340-539-5254 (in the [place] Summary  Generalized anxiety disorder (GAD) is a mental health condition that involves worry that is not triggered by a specific event.  People with GAD often worry excessively about many things in their lives, such as their health and family.  GAD may cause symptoms such as restlessness, trouble concentrating, sleep problems, frequent sweating, nausea, diarrhea, headaches, and trembling or muscle twitching.  A mental health specialist can help determine which treatment is best for you. Some people see improvement with one type of therapy. However, other people require a combination of therapies. This information is not intended to replace advice given to you by your health care provider. Make sure you discuss any questions you have with your health care provider. Document Revised: 08/02/2019 Document Reviewed: 08/02/2019 Elsevier Patient Education  2021 Elsevier Inc.   Follow up:  Follow up with Dr. Earlene Plater in 3 months

## 2021-01-23 NOTE — Assessment & Plan Note (Signed)
Anxiety state:  Continue Zoloft therapy for anxiety. Working well for patient. Discussed potential side effects and need to take daily for best efficacy.   - sertraline (ZOLOFT) 25 MG tablet; Take two tablets daily.    Follow up:  Follow up with Dr. Earlene Plater in 3 months

## 2021-01-24 ENCOUNTER — Telehealth: Payer: Medicaid Other | Admitting: Internal Medicine

## 2021-01-28 ENCOUNTER — Ambulatory Visit: Payer: Medicaid Other

## 2021-01-28 ENCOUNTER — Telehealth: Payer: Self-pay

## 2021-01-28 NOTE — Telephone Encounter (Signed)
Spoke with patient regarding cancelling last two appointments due to ongoing neck pain. Patient feels that his pain has worsened since beginning PT and is in agreement to discharge from therapy at this time and follow-up with referring provider.

## 2021-01-30 ENCOUNTER — Ambulatory Visit: Payer: Medicaid Other | Admitting: Physical Therapy

## 2021-02-06 ENCOUNTER — Ambulatory Visit: Payer: Medicaid Other

## 2021-02-11 ENCOUNTER — Encounter: Payer: Medicaid Other | Admitting: Physical Therapy

## 2021-02-13 ENCOUNTER — Encounter: Payer: Medicaid Other | Admitting: Physical Therapy

## 2021-02-14 ENCOUNTER — Ambulatory Visit (INDEPENDENT_AMBULATORY_CARE_PROVIDER_SITE_OTHER): Payer: Medicaid Other | Admitting: Family Medicine

## 2021-02-14 ENCOUNTER — Other Ambulatory Visit: Payer: Self-pay

## 2021-02-14 DIAGNOSIS — G8929 Other chronic pain: Secondary | ICD-10-CM | POA: Diagnosis not present

## 2021-02-14 DIAGNOSIS — M5010 Cervical disc disorder with radiculopathy, unspecified cervical region: Secondary | ICD-10-CM | POA: Diagnosis not present

## 2021-02-14 DIAGNOSIS — M25512 Pain in left shoulder: Secondary | ICD-10-CM | POA: Diagnosis not present

## 2021-02-14 MED ORDER — ACETAMINOPHEN-CODEINE #3 300-30 MG PO TABS: 1.0000 | ORAL_TABLET | Freq: Four times a day (QID) | ORAL | 0 refills | Status: DC | PRN

## 2021-02-14 MED ORDER — MELOXICAM 15 MG PO TABS: 7.5000 mg | ORAL_TABLET | Freq: Every day | ORAL | 6 refills | Status: DC | PRN

## 2021-02-14 NOTE — Progress Notes (Signed)
   Office Visit Note   Patient: Kevin Stephenson           Date of Birth: [birth year]           MRN: [id number] Visit Date: 02/14/2021 Requested by: Arvilla Market, DO [address] [place],  [place] [address] PCP: Arvilla Market, DO  Subjective: Chief Complaint  Patient presents with  . Left Shoulder - Follow-up, Pain  . Neck - Follow-up, Pain    Left shoulder is getting better with PT, but the neck is hurting worse. He never got the nabumetone - was unaware that this was sent in to the pharmacy.    HPI: He is here for follow-up neck and left shoulder pain.  He has completed physical therapy.  Unfortunately his neck continues to bother him.  He was getting temporary relief with traction, but then it would make him worse for the next few days.  His left shoulder and arm pain have improved, and he is not dropping things like he used to.  But he is very discouraged by his neck pain and is unable to pass a DOT physical because of it.               ROS:   All other systems were reviewed and are negative.  Objective: Vital Signs: There were no vitals taken for this visit.  Physical Exam:  General:  Alert and oriented, in no acute distress. Pulm:  Breathing unlabored. Psy:  Normal mood, congruent affect.  Neck: Limited rotation bilaterally, Spurling's test is equivocal on the left.  He is tender in the left cervical paraspinous muscles and the left trapezius belly.  Upper extremity strength and reflexes are normal today.  Imaging: No results found.  Assessment & Plan: 1.  Persistent neck pain, suspicious for disc protrusion -We will proceed with MRI scan.  Refilled medications.     Procedures: No procedures performed        PMFS History: Patient Active Problem List   Diagnosis Date Noted  . Anxiety state 11/08/2020  . Pain in joint of left shoulder 11/14/2019  . Cervical radicular pain 11/14/2019  . Elevated blood pressure reading 11/14/2019  . Vapes  nicotine containing substance 11/14/2019   No past medical history on file.  Family History  Problem Relation Age of Onset  . Heart failure Father   . Colon cancer Father   . Heart disease Father     Past Surgical History:  Procedure Laterality Date  . APPENDECTOMY    . DENTAL SURGERY  08/2019   all teeth removed   Social History   Occupational History  . Occupation: [employer] doctor  Tobacco Use  . Smoking status: Former Smoker    Quit date: 2015    Years since quitting: 7.3  . Smokeless tobacco: Never Used  Vaping Use  . Vaping Use: Every day  Substance and Sexual Activity  . Alcohol use: No  . Drug use: No  . Sexual activity: Not on file

## 2021-02-15 ENCOUNTER — Other Ambulatory Visit: Payer: Self-pay | Admitting: Internal Medicine

## 2021-02-18 ENCOUNTER — Encounter: Payer: Medicaid Other | Admitting: Physical Therapy

## 2021-02-20 ENCOUNTER — Other Ambulatory Visit: Payer: Medicaid Other

## 2021-02-21 NOTE — Telephone Encounter (Signed)
Called pt, no answer. LVM to contact Primary Care at [facility] to schedule follow up and/or Medication refills for May if needed. Please advise and thank you

## 2021-02-27 ENCOUNTER — Other Ambulatory Visit: Payer: Self-pay | Admitting: Physician Assistant

## 2021-02-27 DIAGNOSIS — R12 Heartburn: Secondary | ICD-10-CM

## 2021-03-21 ENCOUNTER — Other Ambulatory Visit: Payer: Self-pay | Admitting: Family Medicine

## 2021-04-23 ENCOUNTER — Telehealth: Payer: Self-pay | Admitting: Family Medicine

## 2021-04-23 NOTE — Telephone Encounter (Signed)
I called, no answer.  ?

## 2021-04-23 NOTE — Telephone Encounter (Signed)
PT called and needs someone to call him to talk about him being denied for his MRI.   CB [phone]

## 2021-04-24 NOTE — Telephone Encounter (Signed)
I called and spoke with the patient. He had gotten a denial letter from [facility] and so his MRI was denied. The reason for denial in the letter, said that no clinicals were sent to them (detailed exam, last ov note, tests, xrays, etc). I checked with Martie Lee (referral coordinator) -- she said she will open this referral up again and resubmit for prior authorization. I advised the patient that he will receive a call from [facility] or [facility] (or both) with an update on this/appointment.

## 2021-04-25 ENCOUNTER — Encounter: Payer: Self-pay | Admitting: Family Medicine

## 2021-04-30 ENCOUNTER — Other Ambulatory Visit: Payer: Self-pay

## 2021-04-30 DIAGNOSIS — M5010 Cervical disc disorder with radiculopathy, unspecified cervical region: Secondary | ICD-10-CM

## 2021-05-10 ENCOUNTER — Other Ambulatory Visit: Payer: Self-pay

## 2021-05-10 ENCOUNTER — Ambulatory Visit
Admission: RE | Admit: 2021-05-10 | Discharge: 2021-05-10 | Disposition: A | Discharge status: Home / Self Care | Payer: Medicaid Other | Source: Ambulatory Visit | Attending: Family Medicine | Admitting: Family Medicine

## 2021-05-10 DIAGNOSIS — M4802 Spinal stenosis, cervical region: Secondary | ICD-10-CM | POA: Diagnosis not present

## 2021-05-10 DIAGNOSIS — M5010 Cervical disc disorder with radiculopathy, unspecified cervical region: Secondary | ICD-10-CM

## 2021-05-12 ENCOUNTER — Telehealth: Payer: Self-pay | Admitting: Family Medicine

## 2021-05-12 NOTE — Telephone Encounter (Signed)
Neck MRI shows bone spurs and disc protrusions at C4-5, C5-6 and C6-7 levels.  This results in narrowing of spinal canal and nerve openings, and could cause nerve impingement at times.    Treatment options include referral for epidural steroid injections, continued physical therapy, or possibly surgical consult.

## 2021-05-16 ENCOUNTER — Telehealth: Payer: Self-pay | Admitting: Family Medicine

## 2021-05-16 DIAGNOSIS — M5010 Cervical disc disorder with radiculopathy, unspecified cervical region: Secondary | ICD-10-CM

## 2021-05-16 NOTE — Telephone Encounter (Signed)
Patient called needing his MRI results. The number to contact patient is [phone]

## 2021-05-19 ENCOUNTER — Other Ambulatory Visit: Payer: Self-pay | Admitting: Family Medicine

## 2021-05-19 NOTE — Telephone Encounter (Signed)
I called and advised the patient of the results. He would like to try the [facility] route.

## 2021-05-23 ENCOUNTER — Telehealth: Payer: Self-pay | Admitting: Physical Medicine and Rehabilitation

## 2021-05-23 NOTE — Telephone Encounter (Signed)
Patient called. Returning a call to [place] 

## 2021-05-26 ENCOUNTER — Encounter: Payer: Self-pay | Admitting: Physician Assistant

## 2021-05-26 ENCOUNTER — Ambulatory Visit (INDEPENDENT_AMBULATORY_CARE_PROVIDER_SITE_OTHER): Payer: Medicaid Other | Admitting: Physician Assistant

## 2021-05-26 ENCOUNTER — Other Ambulatory Visit: Payer: Self-pay

## 2021-05-26 ENCOUNTER — Telehealth: Payer: Self-pay | Admitting: Physical Medicine and Rehabilitation

## 2021-05-26 VITALS — BP 134/85 | HR 88 | Temp 98.2°F | Resp 18 | Ht 74.0 in | Wt 338.0 lb

## 2021-05-26 DIAGNOSIS — M5412 Radiculopathy, cervical region: Secondary | ICD-10-CM

## 2021-05-26 DIAGNOSIS — Z13220 Encounter for screening for lipoid disorders: Secondary | ICD-10-CM

## 2021-05-26 DIAGNOSIS — R12 Heartburn: Secondary | ICD-10-CM

## 2021-05-26 DIAGNOSIS — Z1159 Encounter for screening for other viral diseases: Secondary | ICD-10-CM

## 2021-05-26 DIAGNOSIS — F411 Generalized anxiety disorder: Secondary | ICD-10-CM | POA: Diagnosis not present

## 2021-05-26 DIAGNOSIS — F331 Major depressive disorder, recurrent, moderate: Secondary | ICD-10-CM

## 2021-05-26 MED ORDER — SERTRALINE HCL 100 MG PO TABS: ORAL_TABLET | ORAL | 1 refills | Status: DC

## 2021-05-26 MED ORDER — PANTOPRAZOLE SODIUM 40 MG PO TBEC: 40.0000 mg | DELAYED_RELEASE_TABLET | Freq: Every day | ORAL | 1 refills | Status: DC

## 2021-05-26 MED ORDER — ACETAMINOPHEN-CODEINE 300-30 MG PO TABS: 1.0000 | ORAL_TABLET | Freq: Four times a day (QID) | ORAL | 1 refills | Status: DC | PRN

## 2021-05-26 NOTE — Progress Notes (Signed)
Established Patient Office Visit  Subjective:  Patient ID: Kevin Stephenson, male    DOB: 07/24/1976  Age: 45 y.o. MRN: [id number]  CC:  Chief Complaint  Patient presents with   Medication Refill    Pain/Acid    HPI Kevin MinervaDarrell Behl requests refills on his pain medication, states that he has been out for the last 2 days, states that he has been having close follow-up with orthopedics but has been unable to communicate with them for medication refill.  Reports that he has daily neck pain for the last 2 years.  Note from last Ortho message:  Neck MRI shows bone spurs and disc protrusions at C4-5, C5-6 and C6-7 levels.  This results in narrowing of spinal canal and nerve openings, and could cause nerve impingement at times.     Treatment options include referral for epidural steroid injections, continued physical therapy, or possibly surgical consult.     Also states that he has been having depressed moods, does not feel his Zoloft is offering as much relief as it was in the past.  Reports that he has been taking 50 mg.  Reports that he has difficulty sleeping, states this is due to the neck pain.  Reports that he does have improved sleep using Tylenol 3 before he goes to bed.  States that he has tried melatonin, but states it does not offer relief and makes him feel groggy the next day.  Does endorse that occasionally the pain will make him feel that he would be "better off dead,", but adamantly denies any plans for self-harm.       History reviewed. No pertinent past medical history.  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  Tobacco Use   Smoking status: Former    Types: Cigarettes    Quit date: 2015    Years since quitting: 7.5   Smokeless tobacco: Never  Vaping Use   Vaping Use: Every day  Substance and Sexual Activity   Alcohol use: No   Drug use: No   Sexual activity: Not Currently  Other Topics Concern   Not on file  Social History Narrative   Not on file   Social Determinants of Health   Financial Resource Strain: Not on file  Food Insecurity: Not on file  Transportation Needs: Not on file  Physical Activity: Not on file  Stress: Not on file  Social Connections: Not on file  Intimate Partner Violence: Not on file    Outpatient Medications Prior to Visit  Medication Sig Dispense Refill   meloxicam (MOBIC) 15 MG tablet Take 0.5-1 tablets (7.5-15 mg total) by mouth daily as needed for pain. 30 tablet 6   Acetaminophen-Codeine 300-30 MG tablet TAKE 1 TABLET BY MOUTH EVERY 6 HOURS AS NEEDED FOR MODERATE PAIN 20 tablet 1   pantoprazole (PROTONIX) 40 MG tablet TAKE 1 TABLET BY MOUTH EVERY DAY 30 tablet 1   sertraline (ZOLOFT) 25 MG tablet  Take one tablet daily. If tolerating, increase to two tablets in one week. 60 tablet 3   nabumetone (RELAFEN) 750 MG tablet Take 1 tablet (750 mg total) by mouth 2 (two) times daily as needed. (Patient not taking: No sig reported) 60 tablet 6   No facility-administered medications prior to visit.    No Known Allergies  ROS Review of Systems  Constitutional:  Negative for chills and fever.  HENT: Negative.    Eyes: Negative.   Respiratory:  Negative for shortness of breath.   Cardiovascular:  Negative for chest pain.  Gastrointestinal: Negative.   Endocrine: Negative.   Genitourinary: Negative.   Musculoskeletal:  Positive for neck pain.  Skin: Negative.   Allergic/Immunologic: Negative.   Neurological: Negative.   Hematological: Negative.   Psychiatric/Behavioral:   Positive for dysphoric mood and sleep disturbance. Negative for self-injury and suicidal ideas. The patient is nervous/anxious.      Objective:    Physical Exam Vitals and nursing note reviewed.  Constitutional:      Appearance: Normal appearance. He is obese.  HENT:     Head: Normocephalic and atraumatic.     Right Ear: External ear normal.     Left Ear: External ear normal.     Nose: Nose normal.     Mouth/Throat:     Mouth: Mucous membranes are moist.     Pharynx: Oropharynx is clear.  Eyes:     Extraocular Movements: Extraocular movements intact.     Conjunctiva/sclera: Conjunctivae normal.     Pupils: Pupils are equal, round, and reactive to light.  Cardiovascular:     Rate and Rhythm: Normal rate and regular rhythm.     Pulses: Normal pulses.     Heart sounds: Normal heart sounds.  Pulmonary:     Effort: Pulmonary effort is normal.     Breath sounds: Normal breath sounds.  Musculoskeletal:        General: Normal range of motion.     Cervical back: Neck supple. Tenderness present.  Skin:    General: Skin is warm and dry.  Neurological:     General: No focal deficit present.     Mental Status: He is alert and oriented to person, place, and time.  Psychiatric:        Mood and Affect: Mood normal.        Behavior: Behavior normal.        Thought Content: Thought content normal.        Judgment: Judgment normal.    BP 134/85 (BP Location: Right Arm, Patient Position: Sitting, Cuff Size: Large)   Pulse 88   Temp 98.2 F (36.8 C) (Temporal)   Resp 18   Ht 6\' 2"  (1.88 m)   Wt (!) 338 lb (153.3 kg)   SpO2 97%   BMI 43.40 kg/m  Wt Readings from Last 3 Encounters:  05/26/21 (!) 338 lb (153.3 kg)  10/10/20 (!) 325 lb (147.4 kg)  12/26/19 297 lb 6.4 oz (134.9 kg)     Health Maintenance Due  Topic Date Due   Hepatitis C Screening  Never done   COVID-19 Vaccine (2 - Pfizer series) 12/05/2020   COLONOSCOPY (Pts 45-55yrs Insurance coverage will need to be  confirmed)  Never done   INFLUENZA VACCINE  05/26/2021    There are no preventive care reminders to display for this patient.  No results found for: TSH No results found for: WBC, HGB, HCT, MCV, PLT Lab Results  Component Value Date  NA 142 12/26/2019   K 4.0 12/26/2019   CO2 24 12/26/2019   GLUCOSE 84 12/26/2019   BUN 16 12/26/2019   CREATININE 1.00 12/26/2019   CALCIUM 9.6 12/26/2019   No results found for: CHOL No results found for: HDL No results found for: LDLCALC No results found for: TRIG No results found for: CHOLHDL No results found for: PQZR0Q    Assessment & Plan:   Problem List Items Addressed This Visit       Other   Cervical radicular pain - Primary   Relevant Medications   Acetaminophen-Codeine 300-30 MG tablet   Anxiety state   Relevant Medications   sertraline (ZOLOFT) 100 MG tablet   Other Relevant Orders   TSH   Other Visit Diagnoses     Moderate episode of recurrent major depressive disorder (HCC)       Relevant Medications   sertraline (ZOLOFT) 100 MG tablet   Other Relevant Orders   CBC with Differential/Platelet   Comp. Metabolic Panel (12)   Heartburn       Relevant Medications   pantoprazole (PROTONIX) 40 MG tablet   Encounter for HCV screening test for low risk patient       Relevant Orders   HCV Ab w Reflex to Quant PCR   Screening, lipid       Relevant Orders   Lipid panel       Meds ordered this encounter  Medications   Acetaminophen-Codeine 300-30 MG tablet    Sig: Take 1 tablet by mouth every 6 (six) hours as needed.    Dispense:  20 tablet    Refill:  1    Not to exceed 5 additional fills before 08/13/2021    Order Specific Question:   Supervising Provider    Answer:   Shan Levans E [[id number]]   pantoprazole (PROTONIX) 40 MG tablet    Sig: Take 1 tablet (40 mg total) by mouth daily.    Dispense:  30 tablet    Refill:  1    Order Specific Question:   Supervising Provider    Answer:   Shan Levans E [[id number]]    sertraline (ZOLOFT) 100 MG tablet    Sig: Take one tablet daily. If tolerating, increase to two tablets in one week.    Dispense:  30 tablet    Refill:  1    Dose change    Order Specific Question:   Supervising Provider    Answer:   Delford Field, PATRICK E [[id number]]  1. Cervical radicular pain Resume Tylenol 3 as needed, check of [place] controlled substance registry appropriate.  Continue follow-up with orthopedics - Acetaminophen-Codeine 300-30 MG tablet; Take 1 tablet by mouth every 6 (six) hours as needed.  Dispense: 20 tablet; Refill: 1  2. Moderate episode of recurrent major depressive disorder (HCC) Increase Zoloft 100 mg.  Patient declines referral for CBT.  Red flags given for prompt reevaluation patient education given on good sleep hygiene - sertraline (ZOLOFT) 100 MG tablet; Take one tablet daily. If tolerating, increase to two tablets in one week.  Dispense: 30 tablet; Refill: 1 - CBC with Differential/Platelet; Future - Comp. Metabolic Panel (12); Future  3. Anxiety state  - sertraline (ZOLOFT) 100 MG tablet; Take one tablet daily. If tolerating, increase to two tablets in one week.  Dispense: 30 tablet; Refill: 1 - TSH; Future  4. Heartburn Continue current regimen - pantoprazole (PROTONIX) 40 MG tablet; Take 1 tablet (40 mg total) by mouth daily.  Dispense:  30 tablet; Refill: 1  5. Encounter for HCV screening test for low risk patient Patient to return for fasting labs - HCV Ab w Reflex to Quant PCR; Future  6. Screening, lipid  - Lipid panel; Future  Patient given appointment to establish with new provider at Primary Care at [facility], was patient of Dr. Earlene Plater.   I have reviewed the patient's medical history (PMH, PSH, Social History, Family History, Medications, and allergies) , and have been updated if relevant. I spent 31 minutes reviewing chart and  face to face time with patient.    Follow-up: Return in about 2 months (around 07/26/2021) for To  establish PCP.    Kasandra Knudsen Mayers, PA-C

## 2021-05-26 NOTE — Patient Instructions (Signed)
I sent your refills to your pharmacy.  I encourage you to return promptly for your fasting labs.  You will take an increased dose of Zoloft, 100 mg once daily.  I encourage you to set a routine to make sure you are taking your medications on a daily basis.  Roney Jaffe, PA-C Physician Assistant [facility] [email or web address]   Health Maintenance, Male Adopting a healthy lifestyle and getting preventive care are important in promoting health and wellness. Ask your health care provider about: The right schedule for you to have regular tests and exams. Things you can do on your own to prevent diseases and keep yourself healthy. What should I know about diet, weight, and exercise? Eat a healthy diet  Eat a diet that includes plenty of vegetables, fruits, low-fat dairy products, and lean protein. Do not eat a lot of foods that are high in solid fats, added sugars, or sodium.  Maintain a healthy weight Body mass index (BMI) is a measurement that can be used to identify possible weight problems. It estimates body fat based on height and weight. Your health care provider can help determine your BMI and help you achieve or maintain ahealthy weight. Get regular exercise Get regular exercise. This is one of the most important things you can do for your health. Most adults should: Exercise for at least 150 minutes each week. The exercise should increase your heart rate and make you sweat (moderate-intensity exercise). Do strengthening exercises at least twice a week. This is in addition to the moderate-intensity exercise. Spend less time sitting. Even light physical activity can be beneficial. Watch cholesterol and blood lipids Have your blood tested for lipids and cholesterol at 45 years of age, then havethis test every 5 years. You may need to have your cholesterol levels checked more often if: Your lipid or cholesterol levels are  high. You are older than 45 years of age. You are at high risk for heart disease. What should I know about cancer screening? Many types of cancers can be detected early and may often be prevented. Depending on your health history and family history, you may need to have cancer screening at various ages. This may include screening for: Colorectal cancer. Prostate cancer. Skin cancer. Lung cancer. What should I know about heart disease, diabetes, and high blood pressure? Blood pressure and heart disease High blood pressure causes heart disease and increases the risk of stroke. This is more likely to develop in people who have high blood pressure readings, are of African descent, or are overweight. Talk with your health care provider about your target blood pressure readings. Have your blood pressure checked: Every 3-5 years if you are 67-60 years of age. Every year if you are 71 years old or older. If you are between the ages of 65 and 66 and are a current or former smoker, ask your health care provider if you should have a one-time screening for abdominal aortic aneurysm (AAA). Diabetes Have regular diabetes screenings. This checks your fasting blood sugar level. Have the screening done: Once every three years after age 65 if you are at a normal weight and have a low risk for diabetes. More often and at a younger age if you are overweight or have a high risk for diabetes. What should I know about preventing infection? Hepatitis B If you have a higher risk for hepatitis B, you should be screened for this virus. Talk with your health care provider to find out if  you are at risk forhepatitis B infection. Hepatitis C Blood testing is recommended for: Everyone born from [birth year] through 1965. Anyone with known risk factors for hepatitis C. Sexually transmitted infections (STIs) You should be screened each year for STIs, including gonorrhea and chlamydia, if: You are sexually active and are  younger than 45 years of age. You are older than 45 years of age and your health care provider tells you that you are at risk for this type of infection. Your sexual activity has changed since you were last screened, and you are at increased risk for chlamydia or gonorrhea. Ask your health care provider if you are at risk. Ask your health care provider about whether you are at high risk for HIV. Your health care provider may recommend a prescription medicine to help prevent HIV infection. If you choose to take medicine to prevent HIV, you should first get tested for HIV. You should then be tested every 3 months for as long as you are taking the medicine. Follow these instructions at home: Lifestyle Do not use any products that contain nicotine or tobacco, such as cigarettes, e-cigarettes, and chewing tobacco. If you need help quitting, ask your health care provider. Do not use street drugs. Do not share needles. Ask your health care provider for help if you need support or information about quitting drugs. Alcohol use Do not drink alcohol if your health care provider tells you not to drink. If you drink alcohol: Limit how much you have to 0-2 drinks a day. Be aware of how much alcohol is in your drink. In the [place] one drink equals one 12 oz bottle of beer (355 mL), one 5 oz glass of wine (148 mL), or one 1 oz glass of hard liquor (44 mL). General instructions Schedule regular health, dental, and eye exams. Stay current with your vaccines. Tell your health care provider if: You often feel depressed. You have ever been abused or do not feel safe at home. Summary Adopting a healthy lifestyle and getting preventive care are important in promoting health and wellness. Follow your health care provider's instructions about healthy diet, exercising, and getting tested or screened for diseases. Follow your health care provider's instructions on monitoring your cholesterol and blood pressure. This  information is not intended to replace advice given to you by your health care provider. Make sure you discuss any questions you have with your healthcare provider. Document Revised: 10/05/2018 Document Reviewed: 10/05/2018 Elsevier Patient Education  2022 [employer].

## 2021-05-26 NOTE — Progress Notes (Signed)
Patient has not taken medication and patient has not eaten today. Patient request refill of Tylenol 3 due to being out for 2 days and playing phone tag with the orthopedic. Patient request refill on Protonix. Patient reports daily neck pain for 2 years with steroid injection plan with ortho.

## 2021-05-26 NOTE — Telephone Encounter (Signed)
Pt called requesting a call back from [facility]. Please call pt at [phone]. Pt did not close reason for call back.

## 2021-06-03 ENCOUNTER — Other Ambulatory Visit: Payer: Medicaid Other

## 2021-06-03 ENCOUNTER — Other Ambulatory Visit: Payer: Self-pay

## 2021-06-03 DIAGNOSIS — F331 Major depressive disorder, recurrent, moderate: Secondary | ICD-10-CM | POA: Diagnosis not present

## 2021-06-03 DIAGNOSIS — Z13220 Encounter for screening for lipoid disorders: Secondary | ICD-10-CM

## 2021-06-03 DIAGNOSIS — F411 Generalized anxiety disorder: Secondary | ICD-10-CM

## 2021-06-03 DIAGNOSIS — Z1159 Encounter for screening for other viral diseases: Secondary | ICD-10-CM | POA: Diagnosis not present

## 2021-06-04 ENCOUNTER — Encounter: Payer: Self-pay | Admitting: Physician Assistant

## 2021-06-04 LAB — CBC WITH DIFFERENTIAL/PLATELET
Basophils Absolute: 0.1 10*3/uL (ref 0.0–0.2)
Basos: 1 %
EOS (ABSOLUTE): 0.1 10*3/uL (ref 0.0–0.4)
Eos: 2 %
Hematocrit: 48.7 % (ref 37.5–51.0)
Hemoglobin: 16.5 g/dL (ref 13.0–17.7)
Immature Grans (Abs): 0 10*3/uL (ref 0.0–0.1)
Immature Granulocytes: 0 %
Lymphocytes Absolute: 2.5 10*3/uL (ref 0.7–3.1)
Lymphs: 30 %
MCH: 29.9 pg (ref 26.6–33.0)
MCHC: 33.9 g/dL (ref 31.5–35.7)
MCV: 88 fL (ref 79–97)
Monocytes Absolute: 0.6 10*3/uL (ref 0.1–0.9)
Monocytes: 7 %
Neutrophils Absolute: 5.2 10*3/uL (ref 1.4–7.0)
Neutrophils: 60 %
Platelets: 330 10*3/uL (ref 150–450)
RBC: 5.51 x10E6/uL (ref 4.14–5.80)
RDW: 13.9 % (ref 11.6–15.4)
WBC: 8.5 10*3/uL (ref 3.4–10.8)

## 2021-06-04 LAB — HCV INTERPRETATION

## 2021-06-04 LAB — COMP. METABOLIC PANEL (12)
AST: 43 IU/L — ABNORMAL HIGH (ref 0–40)
Albumin/Globulin Ratio: 1.8 (ref 1.2–2.2)
Albumin: 5.1 g/dL — ABNORMAL HIGH (ref 4.0–5.0)
Alkaline Phosphatase: 60 IU/L (ref 44–121)
BUN/Creatinine Ratio: 13 (ref 9–20)
BUN: 14 mg/dL (ref 6–24)
Bilirubin Total: 0.5 mg/dL (ref 0.0–1.2)
Calcium: 9.7 mg/dL (ref 8.7–10.2)
Chloride: 100 mmol/L (ref 96–106)
Creatinine, Ser: 1.04 mg/dL (ref 0.76–1.27)
Globulin, Total: 2.9 g/dL (ref 1.5–4.5)
Glucose: 88 mg/dL (ref 65–99)
Potassium: 4.1 mmol/L (ref 3.5–5.2)
Sodium: 139 mmol/L (ref 134–144)
Total Protein: 8 g/dL (ref 6.0–8.5)
eGFR: 90 mL/min/{1.73_m2} (ref >59)

## 2021-06-04 LAB — LIPID PANEL
Chol/HDL Ratio: 6.7 ratio — ABNORMAL HIGH (ref 0.0–5.0)
Cholesterol, Total: 307 mg/dL — ABNORMAL HIGH (ref 100–199)
HDL: 46 mg/dL (ref >39)
LDL Chol Calc (NIH): 221 mg/dL — ABNORMAL HIGH (ref 0–99)
Triglycerides: 202 mg/dL — ABNORMAL HIGH (ref 0–149)
VLDL Cholesterol Cal: 40 mg/dL (ref 5–40)

## 2021-06-04 LAB — TSH: TSH: 1.03 u[IU]/mL (ref 0.450–4.500)

## 2021-06-04 LAB — HCV AB W REFLEX TO QUANT PCR: HCV Ab: <0.1 s/co ratio (ref 0.0–0.9)

## 2021-06-09 ENCOUNTER — Encounter: Payer: Self-pay | Admitting: Physical Medicine and Rehabilitation

## 2021-06-09 ENCOUNTER — Other Ambulatory Visit: Payer: Self-pay

## 2021-06-09 ENCOUNTER — Ambulatory Visit (INDEPENDENT_AMBULATORY_CARE_PROVIDER_SITE_OTHER): Payer: Medicaid Other | Admitting: Physical Medicine and Rehabilitation

## 2021-06-09 ENCOUNTER — Ambulatory Visit: Payer: Self-pay

## 2021-06-09 VITALS — BP 155/94 | HR 97

## 2021-06-09 DIAGNOSIS — M5412 Radiculopathy, cervical region: Secondary | ICD-10-CM | POA: Diagnosis not present

## 2021-06-09 MED ORDER — BETAMETHASONE SOD PHOS & ACET 6 (3-3) MG/ML IJ SUSP
12.0000 mg | Freq: Once | INTRAMUSCULAR | Status: AC
  Administered 2021-06-09: 12 mg

## 2021-06-09 NOTE — Patient Instructions (Signed)

## 2021-06-09 NOTE — Progress Notes (Signed)
Pt state neck pain that travels down his left arm. Pt state his arm goes numb at times. Pt state any movement of the neck makes the pain worse. Pt state he takes pain meds to help ease his pain.  Numeric Pain Rating Scale and Functional Assessment Average Pain 8   In the last MONTH (on 0-10 scale) has pain interfered with the following?  1. General activity like being  able to carry out your everyday physical activities such as walking, climbing stairs, carrying groceries, or moving a chair?  Rating(10)   +Driver, -BT, -Dye Allergies.

## 2021-06-10 NOTE — Progress Notes (Signed)
Kevin Stephenson - 45 y.o. male MRN [id number]  Date of birth: 29-Dec-1975  Office Visit Note: Visit Date: 06/09/2021 PCP: Arvilla Market, MD Referred by: Leary Roca*  Subjective: Chief Complaint  Patient presents with   Neck - Pain   Left Arm - Pain   HPI:  Kevin Stephenson is a 45 y.o. male who comes in today at the request of Dr. Lavada Mesi for planned Left C7-T1 Cervical Interlaminar epidural steroid injection with fluoroscopic guidance.  The patient has failed conservative care including home exercise, medications, time and activity modification.  This injection will be diagnostic and hopefully therapeutic.  Please see requesting physician notes for further details and justification. MRI reviewed with images and spine model.  MRI reviewed in the note below.     ROS Otherwise per HPI.  Assessment & Plan: Visit Diagnoses:    ICD-10-CM   1. Cervical radiculopathy  M54.12 XR C-ARM NO REPORT    Epidural Steroid injection    betamethasone acetate-betamethasone sodium phosphate (CELESTONE) injection 12 mg      Plan: No additional findings.   Meds & Orders:  Meds ordered this encounter  Medications   betamethasone acetate-betamethasone sodium phosphate (CELESTONE) injection 12 mg    Orders Placed This Encounter  Procedures   XR C-ARM NO REPORT   Epidural Steroid injection    Follow-up: Return if symptoms worsen or fail to improve.   Procedures: No procedures performed  Cervical Epidural Steroid Injection - Interlaminar Approach with Fluoroscopic Guidance  Patient: Kevin Stephenson      Date of Birth: [birth year] MRN: [id number] PCP: Arvilla Market, MD      Visit Date: 06/09/2021   Universal Protocol:    Date/Time: 08/16/226:25 AM  Consent Given By: the patient  Position: PRONE  Additional Comments: Vital signs were monitored before and after the procedure. Patient was prepped and draped in the usual sterile fashion. The  correct patient, procedure, and site was verified.   Injection Procedure Details:   Procedure diagnoses: Cervical radiculopathy [M54.12]    Meds Administered:  Meds ordered this encounter  Medications   betamethasone acetate-betamethasone sodium phosphate (CELESTONE) injection 12 mg     Laterality: Left  Location/Site: C7-T1  Needle: 4.5 in., 20 ga. Tuohy  Needle Placement: Paramedian epidural space  Findings:  -Comments: Excellent flow of contrast into the epidural space.  Procedure Details: Using a paramedian approach from the side mentioned above, the region overlying the inferior lamina was localized under fluoroscopic visualization and the soft tissues overlying this structure were infiltrated with 4 ml. of 1% Lidocaine without Epinephrine. A # 20 gauge, Tuohy needle was inserted into the epidural space using a paramedian approach.  The epidural space was localized using loss of resistance along with contralateral oblique bi-planar fluoroscopic views.  After negative aspirate for air, blood, and CSF, a 2 ml. volume of Isovue-250 was injected into the epidural space and the flow of contrast was observed. Radiographs were obtained for documentation purposes.   The injectate was administered into the level noted above.  Additional Comments:  The patient tolerated the procedure well.  He did have mild vasovagal response postinjection. Dressing: 2 x 2 sterile gauze and Band-Aid    Post-procedure details: Patient was observed during the procedure. Post-procedure instructions were reviewed.  Patient left the clinic in stable condition.   Clinical History: MRI CERVICAL SPINE WITHOUT CONTRAST   TECHNIQUE: Multiplanar, multisequence MR imaging of the cervical spine was performed. No intravenous contrast was administered.  COMPARISON:  Radiograph from 11/14/2019.   FINDINGS: Alignment: Straightening of the normal cervical lordosis. Trace anterolisthesis of C4 on C5.    Vertebrae: Vertebral body height maintained without acute or chronic fracture. Bone marrow signal intensity within normal limits. No discrete or worrisome osseous lesions. Discogenic reactive endplate change present about the C4-5 through C6-7 interspaces with mild marrow edema at the level of C5-6. No other abnormal marrow edema.   Cord: Normal signal and morphology.   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 Craniocervical junction within normal limits. Paraspinous and prevertebral soft tissues normal. Normal flow voids seen within the vertebral arteries bilaterally.   Disc levels:   C2-C3: Mild left-sided uncovertebral hypertrophy without significant disc bulge. Mild left facet degeneration. No spinal stenosis. Mild left C3 foraminal narrowing. Right neural foramina remains patent.   C3-C4: Minimal disc bulge with uncovertebral hypertrophy. Mild facet hypertrophy. No spinal stenosis. Mild left greater than right C4 foraminal narrowing.   C4-C5: Trace anterolisthesis. Left paracentral disc osteophyte complex indents the left ventral thecal sac, contacting and flattening the left hemi cord. Mild spinal stenosis without cord signal changes. Mild left C5 foraminal stenosis. Right neural foramina remains patent.   C5-C6: Degenerative intervertebral disc space narrowing. Broad right subarticular to foraminal disc osteophyte complex indents the right ventral thecal sac (series 10, image 19). Secondary flattening of the right ventral cord without cord signal changes. Mild spinal stenosis with moderate right C6 foraminal narrowing. Left neural foramina remains patent.   C6-C7: Central disc osteophyte complex indents the ventral thecal sac. Mild cord flattening without cord signal changes. Mild spinal stenosis. Superimposed left-sided uncovertebral spurring with mild facet hypertrophy.  Resultant moderate left C7 foraminal stenosis. Right neural foramina remains patent.   C7-T1: Negative interspace. Mild left facet hypertrophy. No canal or foraminal stenosis.   Visualized upper thoracic spine demonstrates no significant finding.   IMPRESSION: 1. 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 2. Left paracentral disc osteophyte complex at C4-5 with resultant mild canal and left C5 foraminal stenosis. 3. Broad right subarticular to foraminal disc osteophyte complex at C5-6 with resultant mild canal and moderate right C6 foraminal stenosis.     Electronically Signed   By: Rise Mu M.D.   On: 05/12/2021 00:19     Objective:  VS:  HT:    WT:   BMI:     BP:(!) 155/94  HR:97bpm  TEMP: ( )  RESP:  Physical Exam Vitals and nursing note reviewed.  Constitutional:      General: He is not in acute distress.    Appearance: Normal appearance. He is obese. He is not ill-appearing.  HENT:     Head: Normocephalic and atraumatic.     Right Ear: External ear normal.     Left Ear: External ear normal.  Eyes:     Extraocular Movements: Extraocular movements intact.  Cardiovascular:     Rate and Rhythm: Normal rate.     Pulses: Normal pulses.  Abdominal:     General: There is no distension.     Palpations: Abdomen is soft.  Musculoskeletal:        General: No signs of injury.     Cervical back: Neck supple. Tenderness present. No rigidity.     Right lower leg: No edema.     Left lower leg: No edema.     Comments: Patient has  good strength in the upper extremities with 5 out of 5 strength in wrist extension long finger flexion APB.  No intrinsic hand muscle atrophy.  Negative Hoffmann's test.  Lymphadenopathy:     Cervical: No cervical adenopathy.  Skin:    Findings: No erythema or rash.  Neurological:     General: No focal deficit present.     Mental Status: He is alert and  oriented to person, place, and time.     Sensory: No sensory deficit.     Motor: No weakness or abnormal muscle tone.     Coordination: Coordination normal.  Psychiatric:        Mood and Affect: Mood normal.        Behavior: Behavior normal.     Imaging: XR C-ARM NO REPORT  Result Date: 06/09/2021 Please see Notes tab for imaging impression.

## 2021-06-10 NOTE — Procedures (Signed)
Cervical Epidural Steroid Injection - Interlaminar Approach with Fluoroscopic Guidance  Patient: Kevin Stephenson      Date of Birth: Feb 24, 1976 MRN: [id number] PCP: Arvilla Market, MD      Visit Date: 06/09/2021   Universal Protocol:    Date/Time: 08/16/226:25 AM  Consent Given By: the patient  Position: PRONE  Additional Comments: Vital signs were monitored before and after the procedure. Patient was prepped and draped in the usual sterile fashion. The correct patient, procedure, and site was verified.   Injection Procedure Details:   Procedure diagnoses: Cervical radiculopathy [M54.12]    Meds Administered:  Meds ordered this encounter  Medications   betamethasone acetate-betamethasone sodium phosphate (CELESTONE) injection 12 mg     Laterality: Left  Location/Site: C7-T1  Needle: 4.5 in., 20 ga. Tuohy  Needle Placement: Paramedian epidural space  Findings:  -Comments: Excellent flow of contrast into the epidural space.  Procedure Details: Using a paramedian approach from the side mentioned above, the region overlying the inferior lamina was localized under fluoroscopic visualization and the soft tissues overlying this structure were infiltrated with 4 ml. of 1% Lidocaine without Epinephrine. A # 20 gauge, Tuohy needle was inserted into the epidural space using a paramedian approach.  The epidural space was localized using loss of resistance along with contralateral oblique bi-planar fluoroscopic views.  After negative aspirate for air, blood, and CSF, a 2 ml. volume of Isovue-250 was injected into the epidural space and the flow of contrast was observed. Radiographs were obtained for documentation purposes.   The injectate was administered into the level noted above.  Additional Comments:  The patient tolerated the procedure well.  He did have mild vasovagal response postinjection. Dressing: 2 x 2 sterile gauze and Band-Aid    Post-procedure  details: Patient was observed during the procedure. Post-procedure instructions were reviewed.  Patient left the clinic in stable condition.

## 2021-06-17 ENCOUNTER — Other Ambulatory Visit: Payer: Self-pay | Admitting: Physician Assistant

## 2021-06-17 DIAGNOSIS — R12 Heartburn: Secondary | ICD-10-CM

## 2021-06-23 ENCOUNTER — Other Ambulatory Visit: Payer: Self-pay | Admitting: *Deleted

## 2021-06-23 NOTE — Patient Instructions (Addendum)
Visit Information  Mr. Kasson Lamere  - as a part of your Medicaid benefit, you are eligible for care management and care coordination services at no cost or copay. I was unable to reach you by phone today but would be happy to help you with your health related needs. Please feel free to call me @ [phone].   A member of the Managed Medicaid care management team will reach out to you again over the next 14 days on 07/08/21 at 11:30am.   Estanislado Emms RN, BSN [place]  [facility] RN Care Coordinator

## 2021-06-23 NOTE — Patient Outreach (Signed)
Care Coordination  06/23/2021  Eivan Gallina 06-29-1976 [id number]   Medicaid Managed Care   Unsuccessful Outreach Note  06/23/2021 Name: Nasri Boakye MRN: [id number] DOB: [birth year]  Referred by: Arvilla Market, MD Reason for referral : High Risk Managed Medicaid (Unsuccessful RNCM initial outreach)   An unsuccessful telephone outreach was attempted today. The patient was referred to the case management team for assistance with care management and care coordination.   Follow Up Plan: The care management team will reach out to the patient again over the next 14 days.   Estanislado Emms RN, BSN [place]  [facility]

## 2021-07-02 ENCOUNTER — Encounter: Payer: Self-pay | Admitting: Physical Medicine and Rehabilitation

## 2021-07-05 ENCOUNTER — Encounter: Payer: Self-pay | Admitting: Physician Assistant

## 2021-07-07 ENCOUNTER — Other Ambulatory Visit: Payer: Self-pay | Admitting: Physician Assistant

## 2021-07-07 DIAGNOSIS — M5412 Radiculopathy, cervical region: Secondary | ICD-10-CM

## 2021-07-08 ENCOUNTER — Other Ambulatory Visit: Payer: Self-pay | Admitting: *Deleted

## 2021-07-08 NOTE — Patient Instructions (Signed)
Visit Information  Mr. Whittaker Lenis  - as a part of your Medicaid benefit, you are eligible for care management and care coordination services at no cost or copay. I was unable to reach you by phone today but would be happy to help you with your health related needs. Please feel free to call me @ [phone].   A member of the Managed Medicaid care management team will reach out to you again over the next 14 days.   Estanislado Emms RN, BSN [place]  [facility]

## 2021-07-08 NOTE — Patient Outreach (Signed)
Care Coordination  07/08/2021  [name] [birth year] [id number]   Medicaid Managed Care   Unsuccessful Outreach Note  07/08/2021 Name: Kevin Stephenson MRN: [id number] DOB: 22-Sep-1976  Referred by: Arvilla Market, MD Reason for referral : High Risk Managed Medicaid (Unsuccessful RNCM initial outreach, 2nd attempt)   A second unsuccessful telephone outreach was attempted today. The patient was referred to the case management team for assistance with care management and care coordination.   Follow Up Plan: A HIPAA compliant phone message was left for the patient providing contact information and requesting a return call.   Estanislado Emms RN, BSN [place]  [facility]

## 2021-07-17 DIAGNOSIS — Z79899 Other long term (current) drug therapy: Secondary | ICD-10-CM | POA: Diagnosis not present

## 2021-07-17 DIAGNOSIS — E559 Vitamin D deficiency, unspecified: Secondary | ICD-10-CM | POA: Diagnosis not present

## 2021-07-17 DIAGNOSIS — M129 Arthropathy, unspecified: Secondary | ICD-10-CM | POA: Diagnosis not present

## 2021-07-17 DIAGNOSIS — Z1159 Encounter for screening for other viral diseases: Secondary | ICD-10-CM | POA: Diagnosis not present

## 2021-07-22 ENCOUNTER — Other Ambulatory Visit: Payer: Self-pay | Admitting: Physician Assistant

## 2021-07-22 ENCOUNTER — Other Ambulatory Visit: Payer: Self-pay | Admitting: Family Medicine

## 2021-07-22 DIAGNOSIS — F411 Generalized anxiety disorder: Secondary | ICD-10-CM

## 2021-07-22 DIAGNOSIS — F331 Major depressive disorder, recurrent, moderate: Secondary | ICD-10-CM

## 2021-07-22 DIAGNOSIS — R12 Heartburn: Secondary | ICD-10-CM

## 2021-07-23 ENCOUNTER — Other Ambulatory Visit: Payer: Self-pay | Admitting: *Deleted

## 2021-07-23 NOTE — Patient Outreach (Signed)
Care Coordination  07/23/2021  Kevin Stephenson 1976-04-27 [id number]   Medicaid Managed Care   Unsuccessful Outreach Note  07/23/2021 Name: Kevin Stephenson MRN: [id number] DOB: May 25, 1976  Referred by: Arvilla Market, MD Stephenson for referral : High Risk Managed Medicaid (Unsuccessful RNCM follow up outreach, 3rd attempt)   Third unsuccessful telephone outreach was attempted today. The patient was referred to the case management team for assistance with care management and care coordination. The patient's primary care provider has been notified of our unsuccessful attempts to make or maintain contact with the patient. The care management team is pleased to engage with this patient at any time in the future should he/she be interested in assistance from the care management team.   Follow Up Plan: We have been unable to make contact with the patient for follow up. The care management team is available to follow up with the patient after provider conversation with the patient regarding recommendation for care management engagement and subsequent re-referral to the care management team.   Estanislado Emms RN, BSN [place]  [facility] RN Care Coordinator

## 2021-07-28 ENCOUNTER — Other Ambulatory Visit: Payer: Self-pay

## 2021-07-28 ENCOUNTER — Ambulatory Visit (INDEPENDENT_AMBULATORY_CARE_PROVIDER_SITE_OTHER): Payer: Medicaid Other | Admitting: Family Medicine

## 2021-07-28 ENCOUNTER — Encounter: Payer: Self-pay | Admitting: Family Medicine

## 2021-07-28 VITALS — BP 132/93 | HR 85 | Resp 16 | Ht 74.0 in | Wt 337.4 lb

## 2021-07-28 DIAGNOSIS — F411 Generalized anxiety disorder: Secondary | ICD-10-CM | POA: Diagnosis not present

## 2021-07-28 DIAGNOSIS — E7849 Other hyperlipidemia: Secondary | ICD-10-CM | POA: Diagnosis not present

## 2021-07-28 DIAGNOSIS — R030 Elevated blood-pressure reading, without diagnosis of hypertension: Secondary | ICD-10-CM

## 2021-07-28 DIAGNOSIS — M5412 Radiculopathy, cervical region: Secondary | ICD-10-CM

## 2021-07-28 MED ORDER — ATORVASTATIN CALCIUM 40 MG PO TABS: 40.0000 mg | ORAL_TABLET | Freq: Every day | ORAL | 5 refills | Status: DC

## 2021-07-28 NOTE — Progress Notes (Signed)
Patient is here for f/up visit neck pain

## 2021-07-28 NOTE — Progress Notes (Signed)
Established Patient Office Visit  Subjective:  Patient ID: Kevin Stephenson, male    DOB: Aug 04, 1976  Age: 45 y.o. MRN: [id number]  CC:  Chief Complaint  Patient presents with   Follow-up    HPI Kevin Stephenson presents for follow-up of chronic medical issues.  Patient reports that he continues with neck pain and has been seen in orthopedics and pain management for his symptoms.  He reports that there is some improvement.  Patient also reports that he continues with anxiety, high cholesterol, and whitecoat hypertension.  No past medical history on file.     Social History   Socioeconomic History   Marital status: Married    Spouse name: Not on file   Number of children: 2   Years of education: 12   Highest education level: Not on file  Occupational History   Occupation: [employer] doctor  Tobacco Use   Smoking status: Former    Types: Cigarettes    Quit date: 2015    Years since quitting: 7.7   Smokeless tobacco: Never  Vaping Use   Vaping Use: Every day  Substance and Sexual Activity   Alcohol use: No   Drug use: No   Sexual activity: Not Currently  Other Topics Concern   Not on file  Social History Narrative   Not on file   Social Determinants of Health   Financial Resource Strain: Not on file  Food Insecurity: Not on file  Transportation Needs: Not on file  Physical Activity: Not on file  Stress: Not on file  Social Connections: Not on file  Intimate Partner Violence: Not on file    ROS Review of Systems  Musculoskeletal:  Positive for neck pain.  Psychiatric/Behavioral:  Positive for sleep disturbance. Negative for self-injury and suicidal ideas. The patient is nervous/anxious.   All other systems reviewed and are negative.  Objective:   Today's Vitals: BP (!) 132/93 (BP Location: Right Arm, Patient Position: Sitting, Cuff Size: Large)   Pulse 85   Resp 16   Ht 6\' 2"  (1.88 m)   Wt (!) 337 lb 6.4 oz (153 kg)   SpO2 96%   BMI 43.32 kg/m    Physical Exam Vitals and nursing note reviewed.  Constitutional:      General: He is not in acute distress.    Appearance: He is obese.  Cardiovascular:     Rate and Rhythm: Normal rate and regular rhythm.  Pulmonary:     Effort: Pulmonary effort is normal.     Breath sounds: Normal breath sounds.  Abdominal:     Palpations: Abdomen is soft.     Tenderness: There is no abdominal tenderness.  Musculoskeletal:     Right lower leg: No edema.     Left lower leg: No edema.  Neurological:     General: No focal deficit present.     Mental Status: He is alert and oriented to person, place, and time.  Psychiatric:        Mood and Affect: Mood is anxious.    Assessment & Plan:   1. Cervical radicular pain Management as per consultant which include Ortho and pain management.  2. Anxiety state Continue present management.  3. Elevated blood pressure reading Patient attributes to whitecoat syndrome which is happened previous visits in the past.  Will monitor  4. Other hyperlipidemia Lipitor 40 mg p.o. daily refilled.   Outpatient Encounter Medications as of 07/28/2021  Medication Sig   HYDROcodone-acetaminophen (NORCO) 7.5-325 MG tablet Take 1  tablet by mouth 2 (two) times daily.   meloxicam (MOBIC) 15 MG tablet Take 0.5-1 tablets (7.5-15 mg total) by mouth daily as needed for pain.   pantoprazole (PROTONIX) 40 MG tablet TAKE 1 TABLET BY MOUTH EVERY DAY   sertraline (ZOLOFT) 100 MG tablet Take one tablet daily. If tolerating, increase to two tablets in one week.   Acetaminophen-Codeine 300-30 MG tablet Take 1 tablet by mouth every 6 (six) hours as needed. (Patient not taking: Reported on 07/28/2021)   nabumetone (RELAFEN) 750 MG tablet Take 1 tablet (750 mg total) by mouth 2 (two) times daily as needed. (Patient not taking: Reported on 07/28/2021)   No facility-administered encounter medications on file as of 07/28/2021.    Follow-up: Return in about 6 months (around 01/26/2022)  for follow up.   Kevin Raymond, MD

## 2021-07-29 ENCOUNTER — Encounter: Payer: Self-pay | Admitting: Family Medicine

## 2021-07-31 DIAGNOSIS — Z79899 Other long term (current) drug therapy: Secondary | ICD-10-CM | POA: Diagnosis not present

## 2021-08-05 ENCOUNTER — Encounter: Payer: Self-pay | Admitting: Physician Assistant

## 2021-08-23 ENCOUNTER — Other Ambulatory Visit: Payer: Self-pay | Admitting: Physician Assistant

## 2021-08-23 DIAGNOSIS — F331 Major depressive disorder, recurrent, moderate: Secondary | ICD-10-CM

## 2021-08-23 DIAGNOSIS — F411 Generalized anxiety disorder: Secondary | ICD-10-CM

## 2021-08-27 ENCOUNTER — Other Ambulatory Visit: Payer: Self-pay | Admitting: Family Medicine

## 2021-08-27 DIAGNOSIS — R12 Heartburn: Secondary | ICD-10-CM

## 2021-08-28 ENCOUNTER — Other Ambulatory Visit: Payer: Self-pay | Admitting: Family Medicine

## 2021-08-28 ENCOUNTER — Other Ambulatory Visit: Payer: Self-pay | Admitting: *Deleted

## 2021-08-28 DIAGNOSIS — R12 Heartburn: Secondary | ICD-10-CM

## 2021-08-28 MED ORDER — PANTOPRAZOLE SODIUM 40 MG PO TBEC: 40.0000 mg | DELAYED_RELEASE_TABLET | Freq: Every day | ORAL | 0 refills | Status: DC

## 2021-09-14 DIAGNOSIS — Z79899 Other long term (current) drug therapy: Secondary | ICD-10-CM | POA: Diagnosis not present

## 2021-10-01 ENCOUNTER — Other Ambulatory Visit: Payer: Self-pay | Admitting: Family Medicine

## 2021-10-01 DIAGNOSIS — R12 Heartburn: Secondary | ICD-10-CM

## 2021-10-23 ENCOUNTER — Other Ambulatory Visit: Payer: Self-pay | Admitting: Family Medicine

## 2021-10-23 DIAGNOSIS — R12 Heartburn: Secondary | ICD-10-CM

## 2021-11-05 DIAGNOSIS — Z79899 Other long term (current) drug therapy: Secondary | ICD-10-CM | POA: Diagnosis not present

## 2021-11-06 ENCOUNTER — Encounter: Payer: Self-pay | Admitting: Family Medicine

## 2021-11-07 ENCOUNTER — Other Ambulatory Visit: Payer: Self-pay

## 2021-11-07 DIAGNOSIS — F331 Major depressive disorder, recurrent, moderate: Secondary | ICD-10-CM

## 2021-11-07 DIAGNOSIS — F411 Generalized anxiety disorder: Secondary | ICD-10-CM

## 2021-11-07 MED ORDER — SERTRALINE HCL 100 MG PO TABS: 200.0000 mg | ORAL_TABLET | Freq: Every day | ORAL | 0 refills | Status: DC

## 2021-11-07 NOTE — Telephone Encounter (Signed)
Refill completed.

## 2021-12-19 DIAGNOSIS — E7800 Pure hypercholesterolemia, unspecified: Secondary | ICD-10-CM | POA: Diagnosis not present

## 2021-12-19 DIAGNOSIS — E559 Vitamin D deficiency, unspecified: Secondary | ICD-10-CM | POA: Diagnosis not present

## 2021-12-19 DIAGNOSIS — M129 Arthropathy, unspecified: Secondary | ICD-10-CM | POA: Diagnosis not present

## 2021-12-19 DIAGNOSIS — Z79899 Other long term (current) drug therapy: Secondary | ICD-10-CM | POA: Diagnosis not present

## 2022-01-11 ENCOUNTER — Other Ambulatory Visit: Payer: Self-pay

## 2022-01-11 ENCOUNTER — Emergency Department (HOSPITAL_COMMUNITY): Payer: Medicaid Other

## 2022-01-11 ENCOUNTER — Emergency Department (HOSPITAL_COMMUNITY)
Admission: EM | Admit: 2022-01-11 | Discharge: 2022-01-11 | Disposition: A | Discharge status: Home / Self Care | Payer: Medicaid Other | Attending: Emergency Medicine | Admitting: Emergency Medicine

## 2022-01-11 ENCOUNTER — Encounter (HOSPITAL_COMMUNITY): Payer: Self-pay | Admitting: Emergency Medicine

## 2022-01-11 DIAGNOSIS — N132 Hydronephrosis with renal and ureteral calculous obstruction: Secondary | ICD-10-CM | POA: Diagnosis not present

## 2022-01-11 DIAGNOSIS — I700 Atherosclerosis of aorta: Secondary | ICD-10-CM | POA: Diagnosis not present

## 2022-01-11 DIAGNOSIS — N503 Cyst of epididymis: Secondary | ICD-10-CM | POA: Diagnosis not present

## 2022-01-11 DIAGNOSIS — R109 Unspecified abdominal pain: Secondary | ICD-10-CM | POA: Diagnosis present

## 2022-01-11 DIAGNOSIS — K760 Fatty (change of) liver, not elsewhere classified: Secondary | ICD-10-CM | POA: Diagnosis not present

## 2022-01-11 DIAGNOSIS — N23 Unspecified renal colic: Secondary | ICD-10-CM | POA: Insufficient documentation

## 2022-01-11 LAB — CBC WITH DIFFERENTIAL/PLATELET
Abs Immature Granulocytes: 0.21 10*3/uL — ABNORMAL HIGH (ref 0.00–0.07)
Basophils Absolute: 0.1 10*3/uL (ref 0.0–0.1)
Basophils Relative: 0 %
Eosinophils Absolute: 0 10*3/uL (ref 0.0–0.5)
Eosinophils Relative: 0 %
HCT: 48.6 % (ref 39.0–52.0)
Hemoglobin: 16.1 g/dL (ref 13.0–17.0)
Immature Granulocytes: 2 %
Lymphocytes Relative: 12 %
Lymphs Abs: 1.7 10*3/uL (ref 0.7–4.0)
MCH: 29.5 pg (ref 26.0–34.0)
MCHC: 33.1 g/dL (ref 30.0–36.0)
MCV: 89.2 fL (ref 80.0–100.0)
Monocytes Absolute: 0.6 10*3/uL (ref 0.1–1.0)
Monocytes Relative: 5 %
Neutro Abs: 11.5 10*3/uL — ABNORMAL HIGH (ref 1.7–7.7)
Neutrophils Relative %: 81 %
Platelets: 373 10*3/uL (ref 150–400)
RBC: 5.45 MIL/uL (ref 4.22–5.81)
RDW: 13.7 % (ref 11.5–15.5)
WBC: 14.1 10*3/uL — ABNORMAL HIGH (ref 4.0–10.5)
nRBC: 0 % (ref 0.0–0.2)

## 2022-01-11 LAB — COMPREHENSIVE METABOLIC PANEL
ALT: 75 U/L — ABNORMAL HIGH (ref 0–44)
AST: 58 U/L — ABNORMAL HIGH (ref 15–41)
Albumin: 5 g/dL (ref 3.5–5.0)
Alkaline Phosphatase: 55 U/L (ref 38–126)
Anion gap: 12 (ref 5–15)
BUN: 17 mg/dL (ref 6–20)
CO2: 26 mmol/L (ref 22–32)
Calcium: 9.7 mg/dL (ref 8.9–10.3)
Chloride: 100 mmol/L (ref 98–111)
Creatinine, Ser: 1.22 mg/dL (ref 0.61–1.24)
GFR, Estimated: >60 mL/min (ref >60)
Glucose, Bld: 142 mg/dL — ABNORMAL HIGH (ref 70–99)
Potassium: 4 mmol/L (ref 3.5–5.1)
Sodium: 138 mmol/L (ref 135–145)
Total Bilirubin: 0.6 mg/dL (ref 0.3–1.2)
Total Protein: 9 g/dL — ABNORMAL HIGH (ref 6.5–8.1)

## 2022-01-11 LAB — URINALYSIS, ROUTINE W REFLEX MICROSCOPIC
Bilirubin Urine: NEGATIVE
Glucose, UA: NEGATIVE mg/dL
Ketones, ur: NEGATIVE mg/dL
Leukocytes,Ua: NEGATIVE
Nitrite: NEGATIVE
Protein, ur: 30 mg/dL — AB
Specific Gravity, Urine: 1.025 (ref 1.005–1.030)
pH: 6 (ref 5.0–8.0)

## 2022-01-11 LAB — URINALYSIS, MICROSCOPIC (REFLEX)

## 2022-01-11 LAB — LIPASE, BLOOD: Lipase: 30 U/L (ref 11–51)

## 2022-01-11 MED ORDER — SODIUM CHLORIDE 0.9 % IV BOLUS
1000.0000 mL | Freq: Once | INTRAVENOUS | Status: AC
  Administered 2022-01-11: 1000 mL via INTRAVENOUS

## 2022-01-11 MED ORDER — KETOROLAC TROMETHAMINE 15 MG/ML IJ SOLN
15.0000 mg | Freq: Once | INTRAMUSCULAR | Status: AC
Start: 2022-01-11 — End: 2022-01-11
  Administered 2022-01-11: 15 mg via INTRAVENOUS
  Filled 2022-01-11: qty 1

## 2022-01-11 MED ORDER — TAMSULOSIN HCL 0.4 MG PO CAPS
0.4000 mg | ORAL_CAPSULE | Freq: Every day | ORAL | 0 refills | Status: AC
Start: 2022-01-11 — End: 2022-01-16

## 2022-01-11 MED ORDER — ONDANSETRON HCL 4 MG PO TABS: 4.0000 mg | ORAL_TABLET | Freq: Four times a day (QID) | ORAL | 0 refills | Status: DC

## 2022-01-11 MED ORDER — MORPHINE SULFATE (PF) 4 MG/ML IV SOLN
4.0000 mg | Freq: Once | INTRAVENOUS | Status: AC
  Administered 2022-01-11: 4 mg via INTRAVENOUS
  Filled 2022-01-11: qty 1

## 2022-01-11 MED ORDER — ONDANSETRON HCL 4 MG/2ML IJ SOLN
4.0000 mg | Freq: Once | INTRAMUSCULAR | Status: AC
  Administered 2022-01-11: 4 mg via INTRAVENOUS
  Filled 2022-01-11: qty 2

## 2022-01-11 MED ORDER — IBUPROFEN 600 MG PO TABS: 600.0000 mg | ORAL_TABLET | Freq: Three times a day (TID) | ORAL | 0 refills | Status: DC | PRN

## 2022-01-11 NOTE — ED Notes (Signed)
Patient in the restroom.

## 2022-01-11 NOTE — ED Provider Triage Note (Addendum)
Emergency Medicine Provider Triage Evaluation Note ? ?Kevin Stephenson , a 46 y.o. male  was evaluated in triage.  Pt complains of lower left back pain, flank pain, LLQ pain, and left testicl pain startn gtoday around 1200. He reports he has some nausea, but no vomiting. Denies any fevers, chest pain, or SOB. Denies h/o kidney stones. He reports he had some testicular swelling yesterday but has since resolved.  ? ?Review of Systems  ?Positive: Flnak pain, back pain, abdominal pain, testicular pain ?Negative: fever, chest pain, SOB ? ?Physical Exam  ?BP (!) 166/113 (BP Location: Left Arm)   Pulse 71   Resp 18   SpO2 96%  ?Gen:   Awake, patient diaphoretic   ?Resp:  Normal effort  ?MSK:   Moves extremities without difficulty  ?Other:  Abdomen soft. GU exam deferred.  ? ?Medical Decision Making  ?Medically screening exam initiated at 5:41 PM.  Appropriate orders placed.  Kevin Stephenson was informed that the remainder of the evaluation will be completed by another provider, this initial triage assessment does not replace that evaluation, and the importance of remaining in the ED until their evaluation is complete. ? ?Likely renal stone, although testicular torsion can not be ruled out.  ?  ?Sherrell Puller, PA-C ?01/11/22 1744 ? ?  ?Sherrell Puller, PA-C ?01/11/22 1745 ? ?

## 2022-01-11 NOTE — ED Triage Notes (Signed)
Patient c/o L flank, LLQ, and L testicle pain today with nausea. Denies urinary symptoms. ?

## 2022-01-11 NOTE — ED Provider Notes (Signed)
?[name]-EMERGENCY DEPT ?Provider Note ? ? ?CSN: [id number] ?Arrival date & time: 01/11/22  1727 ? ?  ? ?History ? ?Chief Complaint  ?Patient presents with  ? Abdominal Pain  ? ? ?Kevin Stephenson is a 46 y.o. male. ? ?46 year old male with prior medical history as detailed below presents for evaluation.  Patient reports cute onset of left flank and left groin pain at around 1 PM this afternoon.  He reports associated nausea and vomiting.  He denies fever, chest pain, shortness of breath.  He denies prior history of renal stones.  He denies dysuria.  He reports that the pain started in his left flank and then moved to his left lower quadrant and then radiated to his left testicle. ? ?The history is provided by the patient and medical records.  ?Abdominal Pain ?Pain location:  L flank ?Pain quality: aching   ?Pain radiates to:  LLQ and groin ?Pain severity:  Moderate ?Onset quality:  Sudden ?Duration:  5 hours ?Timing:  Rare ? ?  ? ?Home Medications ?Prior to Admission medications   ?Medication Sig Start Date End Date Taking? Authorizing Provider  ?Acetaminophen-Codeine 300-30 MG tablet Take 1 tablet by mouth every 6 (six) hours as needed. ?Patient not taking: Reported on 07/28/2021 05/26/21   Mayers, Cari S, PA-C  ?atorvastatin (LIPITOR) 40 MG tablet Take 1 tablet (40 mg total) by mouth daily. 07/28/21   Georganna SkeansWilson, Amelia, MD  ?HYDROcodone-acetaminophen [facility](NORCO) 7.5-325 MG tablet Take 1 tablet by mouth 2 (two) times daily. 07/17/21   [provider]  ?meloxicam (MOBIC) 15 MG tablet Take 0.5-1 tablets (7.5-15 mg total) by mouth daily as needed for pain. 02/14/21   Hilts, Casimiro NeedleMichael, MD  ?pantoprazole (PROTONIX) 40 MG tablet TAKE 1 TABLET BY MOUTH EVERY DAY 10/23/21   Georganna SkeansWilson, Amelia, MD  ?sertraline (ZOLOFT) 100 MG tablet Take 2 tablets (200 mg total) by mouth daily. 11/07/21   Georganna SkeansWilson, Amelia, MD  ?   ? ?Allergies    ?Patient has no known allergies.   ? ?Review of Systems   ?Review of Systems   ?Gastrointestinal:  Positive for abdominal pain.  ?All other systems reviewed and are negative. ? ?Physical Exam ?Updated Vital Signs ?BP (!) 166/113 (BP Location: Left Arm)   Pulse 71   Temp 97.7 ?F (36.5 ?C) (Oral)   Resp 18   SpO2 96%  ?Physical Exam ?Vitals and nursing note reviewed.  ?Constitutional:   ?   General: He is not in acute distress. ?   Appearance: Normal appearance. He is well-developed.  ?HENT:  ?   Head: Normocephalic and atraumatic.  ?Eyes:  ?   Conjunctiva/sclera: Conjunctivae normal.  ?   Pupils: Pupils are equal, round, and reactive to light.  ?Cardiovascular:  ?   Rate and Rhythm: Normal rate and regular rhythm.  ?   Heart sounds: Normal heart sounds.  ?Pulmonary:  ?   Effort: Pulmonary effort is normal. No respiratory distress.  ?   Breath sounds: Normal breath sounds.  ?Abdominal:  ?   General: There is no distension.  ?   Palpations: Abdomen is soft.  ?   Tenderness: There is no abdominal tenderness.  ?Genitourinary: ?   Penis: Normal.   ?   Testes: Normal.     ?   Right: Mass not present.     ?   Left: Mass not present.  ?   Comments: Mild left CVAT ?Musculoskeletal:     ?   General: No deformity. Normal  range of motion.  ?   Cervical back: Normal range of motion and neck supple.  ?Skin: ?   General: Skin is warm and dry.  ?Neurological:  ?   General: No focal deficit present.  ?   Mental Status: He is alert and oriented to person, place, and time.  ? ? ?ED Results / Procedures / Treatments   ?Labs ?(all labs ordered are listed, but only abnormal results are displayed) ?Labs Reviewed  ?CBC WITH DIFFERENTIAL/PLATELET  ?COMPREHENSIVE METABOLIC PANEL  ?URINALYSIS, ROUTINE W REFLEX MICROSCOPIC  ?LIPASE, BLOOD  ? ? ?EKG ?None ? ?Radiology ?No results found. ? ?Procedures ?Procedures  ? ? ?Medications Ordered in ED ?Medications  ?sodium chloride 0.9 % bolus 1,000 mL (has no administration in time range)  ?morphine (PF) 4 MG/ML injection 4 mg (has no administration in time range)   ?ondansetron (ZOFRAN) injection 4 mg (has no administration in time range)  ? ? ?ED Course/ Medical Decision Making/ A&P ?  ?                        ?Medical Decision Making ?Risk ?Prescription drug management. ? ? ? ?Medical Screen Complete ? ?This patient presented to the ED with complaint of left flank pain. ? ?This complaint involves an extensive number of treatment options. The initial differential diagnosis includes, but is not limited to, renal colic, intra-abdominal pathology, metabolic abnormality, urinary infection, etc. ? ?This presentation is: Acute, Chronic, Self-Limited, Previously Undiagnosed, Uncertain Prognosis, Complicated, Systemic Symptoms, and Threat to Life/Bodily Function ? ?Patient is presenting with complaint of cute onset left sided flank discomfort.  Patient reports pain began in the left flank and then moved to the left groin. ? ?Patient's presentation is consistent with likely renal colic. ? ?CT imaging confirms presence of a left ureteral stone.  Patient is imaging shows 3 mm stone at the left UVJ. ? ?Patient improved after administration of IV fluids, pain medicine, and antiemetics. ? ?Other screening labs obtained are without significant abnormality. ? ?Patient does understand need for close follow-up with urology in the outpatient setting.  Strict return precautions given and understood. ? ? ? ?Additional history obtained: ? ?External records from outside sources obtained and reviewed including prior ED visits and prior Inpatient records.  ? ? ?Lab Tests: ? ?I ordered and personally interpreted labs.  The pertinent results include: UA, CBC, CMP, lipase ? ? ?Imaging Studies ordered: ? ?I ordered imaging studies including CT stone hunt, scrotal ultrasound ?I independently visualized and interpreted obtained imaging which showed ureteral stone ?I agree with the radiologist interpretation. ? ? ?Cardiac Monitoring: ? ?The patient was maintained on a cardiac monitor.  I personally viewed  and interpreted the cardiac monitor which showed an underlying rhythm of: NSR ? ? ?Medicines ordered: ? ?I ordered medication including morphine, Toradol, Zofran, IV fluids for renal colic ?Reevaluation of the patient after these medicines showed that the patient: improved ? ? ?Problem List / ED Course: ? ?Renal colic ? ? ?Reevaluation: ? ?After the interventions noted above, I reevaluated the patient and found that they have: improved ? ? ?Disposition: ? ?After consideration of the diagnostic results and the patients response to treatment, I feel that the patent would benefit from close outpatient follow-up.  ? ? ? ? ? ? ? ? ?Final Clinical Impression(s) / ED Diagnoses ?Final diagnoses:  ?Renal colic  ? ? ?Rx / DC Orders ?ED Discharge Orders   ? ?  Ordered  ?  ondansetron (ZOFRAN) 4 MG tablet  Every 6 hours       ? 01/11/22 2137  ?  tamsulosin (FLOMAX) 0.4 MG CAPS capsule  Daily       ? 01/11/22 2137  ?  ibuprofen (ADVIL) 600 MG tablet  Every 8 hours PRN       ? 01/11/22 2137  ? ?  ?  ? ?  ? ? ?  ?Wynetta Fines, MD ?01/11/22 2142 ? ?

## 2022-01-11 NOTE — Discharge Instructions (Addendum)
Return for any problem.  ? ?Drink plenty of fluids. ? ?Strain urine as instructed. ? ?

## 2022-01-12 ENCOUNTER — Telehealth: Payer: Self-pay

## 2022-01-12 NOTE — Telephone Encounter (Signed)
Transition Care Management Follow-up Telephone Call ?Date of discharge and from where: 01/11/2022-[place]  ?How have you been since you were released from the hospital? Pt stated he is doing a lot better.  ?Any questions or concerns? No ? ?Items Reviewed: ?Did the pt receive and understand the discharge instructions provided? Yes  ?Medications obtained and verified? Yes  ?Other? No  ?Any new allergies since your discharge? No  ?Dietary orders reviewed? No ?Do you have support at home? Yes  ? ?Home Care and Equipment/Supplies: ?Were home health services ordered? not applicable ?If so, what is the name of the agency? N/A  ?Has the agency set up a time to come to the patient's home? not applicable ?Were any new equipment or medical supplies ordered?  No ?What is the name of the medical supply agency? N/A ?Were you able to get the supplies/equipment? not applicable ?Do you have any questions related to the use of the equipment or supplies? No ? ?Functional Questionnaire: (I = Independent and D = Dependent) ?ADLs: I ? ?Bathing/Dressing- I ? ?Meal Prep- I ? ?Eating- I ? ?Maintaining continence- I ? ?Transferring/Ambulation- I ? ?Managing Meds- I ? ?Follow up appointments reviewed: ? ?PCP Hospital f/u appt confirmed? Yes  Scheduled to see Dr. Andrey Campanile on 01/26/2022. ?Specialist Hospital f/u appt confirmed? No   ?Are transportation arrangements needed? No  ?If their condition worsens, is the pt aware to call PCP or go to the Emergency Dept.? Yes ?Was the patient provided with contact information for the PCP's office or ED? Yes ?Was to pt encouraged to call back with questions or concerns? Yes  ?

## 2022-01-26 ENCOUNTER — Ambulatory Visit (INDEPENDENT_AMBULATORY_CARE_PROVIDER_SITE_OTHER): Payer: Medicaid Other | Admitting: Family Medicine

## 2022-01-26 ENCOUNTER — Encounter: Payer: Self-pay | Admitting: Family Medicine

## 2022-01-26 VITALS — BP 137/87 | HR 91 | Temp 98.0°F | Resp 16 | Wt 336.6 lb

## 2022-01-26 DIAGNOSIS — F411 Generalized anxiety disorder: Secondary | ICD-10-CM

## 2022-01-26 DIAGNOSIS — I10 Essential (primary) hypertension: Secondary | ICD-10-CM

## 2022-01-26 DIAGNOSIS — E7849 Other hyperlipidemia: Secondary | ICD-10-CM

## 2022-01-26 DIAGNOSIS — M5412 Radiculopathy, cervical region: Secondary | ICD-10-CM | POA: Diagnosis not present

## 2022-01-26 MED ORDER — MELOXICAM 15 MG PO TABS: 7.5000 mg | ORAL_TABLET | Freq: Every day | ORAL | 6 refills | Status: DC | PRN

## 2022-01-26 NOTE — Progress Notes (Signed)
? ?Established Patient Office Visit ? ?Subjective:  ?Patient ID: Kevin Stephenson, male    DOB: 10/09/1976  Age: 46 y.o. MRN: [id number] ? ?CC: No chief complaint on file. ? ? ?HPI ?Kevin Stephenson presents for routine follow up of chronic med issues including depression and GERD. Patient denies acute complaints or concerns.  ? ?No past medical history on file. ? ?Past Surgical History:  ?Procedure Laterality Date  ? APPENDECTOMY    ? DENTAL SURGERY  08/2019  ? all teeth removed  ? ? ?Family History  ?Problem Relation Age of Onset  ? Heart failure Father   ? Colon cancer Father   ? Heart disease Father   ? ? ?Social History  ? ?Socioeconomic History  ? Marital status: Married  ?  Spouse name: Not on file  ? Number of children: 2  ? Years of education: 29  ? Highest education level: Not on file  ?Occupational History  ? Occupation: Administrator  ?Tobacco Use  ? Smoking status: Former  ?  Types: Cigarettes  ?  Quit date: 2015  ?  Years since quitting: 8.2  ? Smokeless tobacco: Never  ?Vaping Use  ? Vaping Use: Every day  ?Substance and Sexual Activity  ? Alcohol use: No  ? Drug use: No  ? Sexual activity: Not Currently  ?Other Topics Concern  ? Not on file  ?Social History Narrative  ? Not on file  ? ?Social Determinants of Health  ? ?Financial Resource Strain: Not on file  ?Food Insecurity: Not on file  ?Transportation Needs: Not on file  ?Physical Activity: Not on file  ?Stress: Not on file  ?Social Connections: Not on file  ?Intimate Partner Violence: Not on file  ? ? ?ROS ?Review of Systems  ?Musculoskeletal:  Positive for neck pain.  ?Psychiatric/Behavioral:  Positive for sleep disturbance. Negative for self-injury and suicidal ideas. The patient is nervous/anxious.   ?All other systems reviewed and are negative. ? ?Objective:  ? ?Today's Vitals: BP 137/87   Pulse 91   Temp 98 ?F (36.7 ?C) (Oral)   Resp 16   Wt (!) 336 lb 9.6 oz (152.7 kg)   SpO2 96%   BMI 43.22 kg/m?  ? ?Physical Exam ?Vitals and nursing  note reviewed.  ?Constitutional:   ?   General: He is not in acute distress. ?   Appearance: He is obese.  ?Cardiovascular:  ?   Rate and Rhythm: Normal rate and regular rhythm.  ?Pulmonary:  ?   Effort: Pulmonary effort is normal.  ?   Breath sounds: Normal breath sounds.  ?Abdominal:  ?   Palpations: Abdomen is soft.  ?   Tenderness: There is no abdominal tenderness.  ?Musculoskeletal:  ?   Right lower leg: No edema.  ?   Left lower leg: No edema.  ?Neurological:  ?   General: No focal deficit present.  ?   Mental Status: He is alert and oriented to person, place, and time.  ?Psychiatric:     ?   Mood and Affect: Mood is anxious.  ? ? ?Assessment & Plan:  ? ?1. Cervical radicular pain ?Meloxicam refilled. Management per consultant ? ?2. Anxiety state ?Continue present management and monitor. Keep scheduled appt with BH ? ?3. Other hyperlipidemia ?Continue present management ? ?4. Essential hypertension ?Doing well with present management ? ? ?Outpatient Encounter Medications as of 01/26/2022  ?Medication Sig  ? Acetaminophen-Codeine 300-30 MG tablet Take 1 tablet by mouth every 6 (six) hours as needed. (  Patient not taking: Reported on 07/28/2021)  ? atorvastatin (LIPITOR) 40 MG tablet Take 1 tablet (40 mg total) by mouth daily.  ? HYDROcodone-acetaminophen (NORCO) 7.5-325 MG tablet Take 1 tablet by mouth 2 (two) times daily.  ? ibuprofen (ADVIL) 600 MG tablet Take 1 tablet (600 mg total) by mouth every 8 (eight) hours as needed.  ? meloxicam (MOBIC) 15 MG tablet Take 0.5-1 tablets (7.5-15 mg total) by mouth daily as needed for pain.  ? ondansetron (ZOFRAN) 4 MG tablet Take 1 tablet (4 mg total) by mouth every 6 (six) hours.  ? pantoprazole (PROTONIX) 40 MG tablet TAKE 1 TABLET BY MOUTH EVERY DAY  ? sertraline (ZOLOFT) 100 MG tablet Take 2 tablets (200 mg total) by mouth daily.  ? [DISCONTINUED] meloxicam (MOBIC) 15 MG tablet Take 0.5-1 tablets (7.5-15 mg total) by mouth daily as needed for pain.  ? ?No  facility-administered encounter medications on file as of 01/26/2022.  ? ? ?Follow-up: Return in about 6 months (around 07/28/2022) for follow up.  ? ?Becky Sax, MD ? ?

## 2022-01-26 NOTE — Progress Notes (Signed)
Patient is here for 3 month follow-up. Patient would like a medication refill. Patient is doing much better with his HTN ?

## 2022-01-28 ENCOUNTER — Encounter: Payer: Self-pay | Admitting: Family Medicine

## 2022-02-01 ENCOUNTER — Other Ambulatory Visit: Payer: Self-pay | Admitting: Family Medicine

## 2022-02-01 DIAGNOSIS — F331 Major depressive disorder, recurrent, moderate: Secondary | ICD-10-CM

## 2022-02-01 DIAGNOSIS — F411 Generalized anxiety disorder: Secondary | ICD-10-CM

## 2022-02-10 DIAGNOSIS — F32A Depression, unspecified: Secondary | ICD-10-CM | POA: Diagnosis not present

## 2022-02-10 DIAGNOSIS — Z79899 Other long term (current) drug therapy: Secondary | ICD-10-CM | POA: Diagnosis not present

## 2022-02-10 DIAGNOSIS — R030 Elevated blood-pressure reading, without diagnosis of hypertension: Secondary | ICD-10-CM | POA: Diagnosis not present

## 2022-02-10 DIAGNOSIS — M5412 Radiculopathy, cervical region: Secondary | ICD-10-CM | POA: Diagnosis not present

## 2022-03-12 DIAGNOSIS — R030 Elevated blood-pressure reading, without diagnosis of hypertension: Secondary | ICD-10-CM | POA: Diagnosis not present

## 2022-03-12 DIAGNOSIS — Z79899 Other long term (current) drug therapy: Secondary | ICD-10-CM | POA: Diagnosis not present

## 2022-03-12 DIAGNOSIS — M5412 Radiculopathy, cervical region: Secondary | ICD-10-CM | POA: Diagnosis not present

## 2022-03-12 DIAGNOSIS — E7800 Pure hypercholesterolemia, unspecified: Secondary | ICD-10-CM | POA: Diagnosis not present

## 2022-03-12 DIAGNOSIS — F32A Depression, unspecified: Secondary | ICD-10-CM | POA: Diagnosis not present

## 2022-03-17 DIAGNOSIS — Z79899 Other long term (current) drug therapy: Secondary | ICD-10-CM | POA: Diagnosis not present

## 2022-04-23 DIAGNOSIS — M5136 Other intervertebral disc degeneration, lumbar region: Secondary | ICD-10-CM | POA: Diagnosis not present

## 2022-04-23 DIAGNOSIS — M5412 Radiculopathy, cervical region: Secondary | ICD-10-CM | POA: Diagnosis not present

## 2022-04-23 DIAGNOSIS — Z79899 Other long term (current) drug therapy: Secondary | ICD-10-CM | POA: Diagnosis not present

## 2022-04-23 DIAGNOSIS — R030 Elevated blood-pressure reading, without diagnosis of hypertension: Secondary | ICD-10-CM | POA: Diagnosis not present

## 2022-04-23 DIAGNOSIS — M5030 Other cervical disc degeneration, unspecified cervical region: Secondary | ICD-10-CM | POA: Diagnosis not present

## 2022-04-27 DIAGNOSIS — Z79899 Other long term (current) drug therapy: Secondary | ICD-10-CM | POA: Diagnosis not present

## 2022-07-28 ENCOUNTER — Ambulatory Visit: Payer: Self-pay | Admitting: Family Medicine

## 2022-08-14 IMAGING — MR MR CERVICAL SPINE W/O CM
4 of 5 series · 27 of 48 positions shown · non-contrast
Comparison: Radiograph from 11/14/2019.

CLINICAL DATA: Initial evaluation for posterior neck pain, left
upper extremity weakness with numbness in tingling.

EXAM:
MRI CERVICAL SPINE WITHOUT CONTRAST
TECHNIQUE: Multiplanar, multisequence MR imaging of the cervical spine was
performed. No intravenous contrast was administered.

[Series 5: T2 · sagittal · 3.0mm · 0.66mm/px · 6 of 15 slices shown (1 of 2)]
[im 1/15]
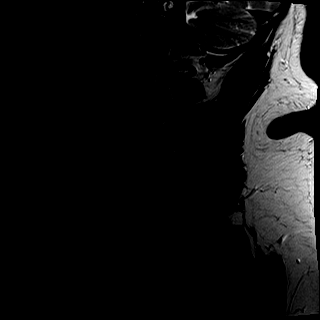
[im 3/15]
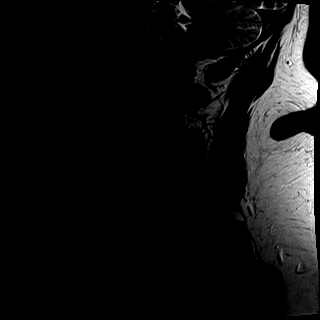
[im 6/15]
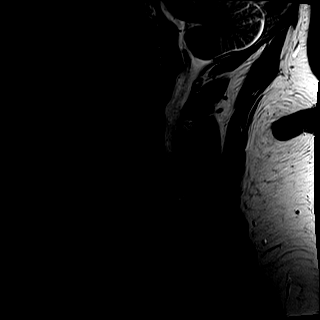
[im 9/15]
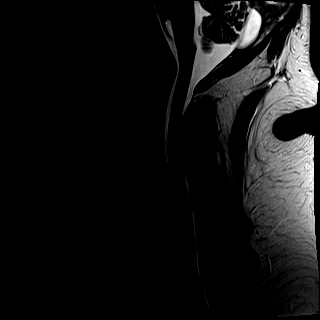
[im 12/15]
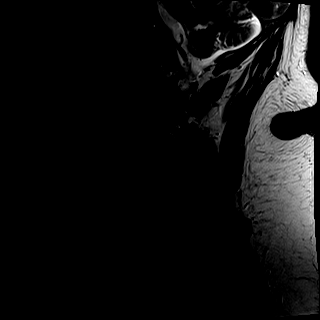
[im 15/15]
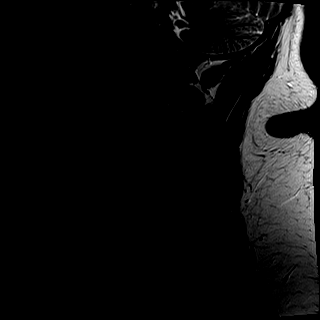

[Series 6: T1 · sagittal · 3.0mm · 0.41mm/px · 7 of 15 slices shown]
[im 1/15]
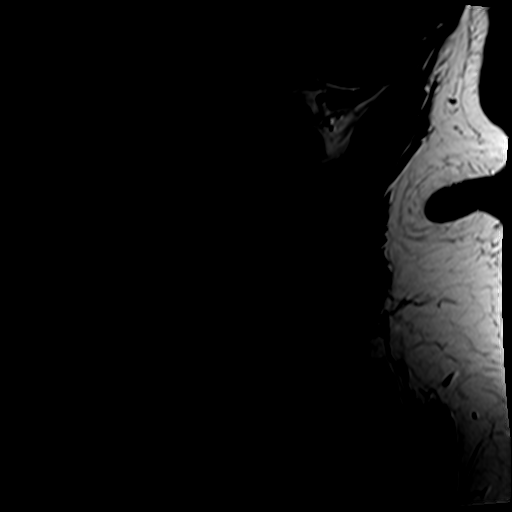
[im 3/15]
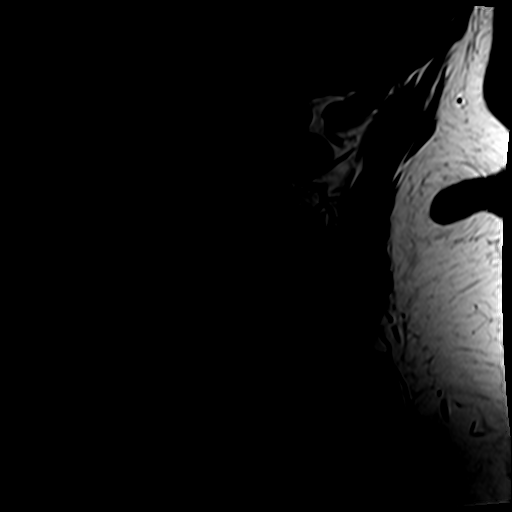
[im 5/15]
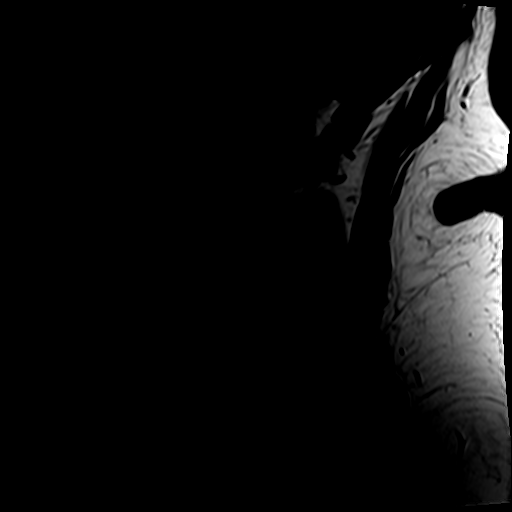
[im 8/15]
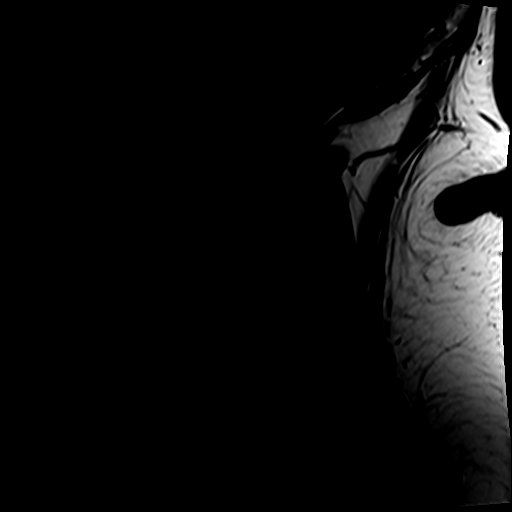
[im 10/15]
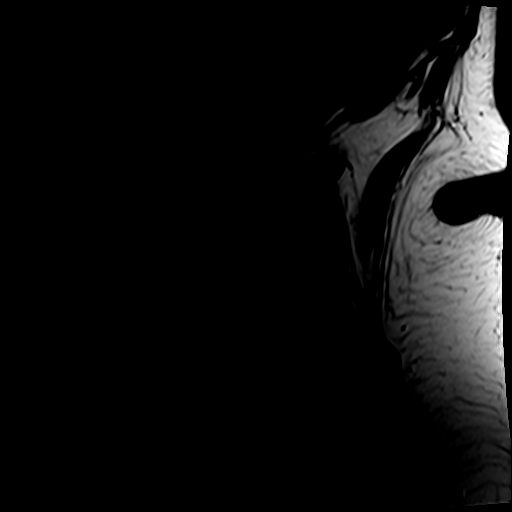
[im 12/15]
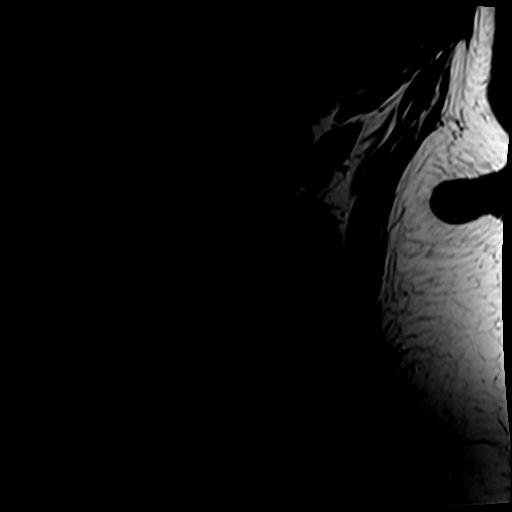
[im 15/15]
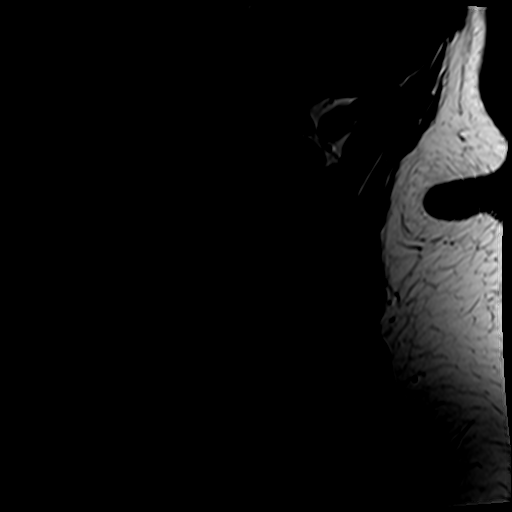

[Series 8: STIR · sagittal · 3.0mm · 0.41mm/px · 6 of 15 slices shown]
[im 1/15]
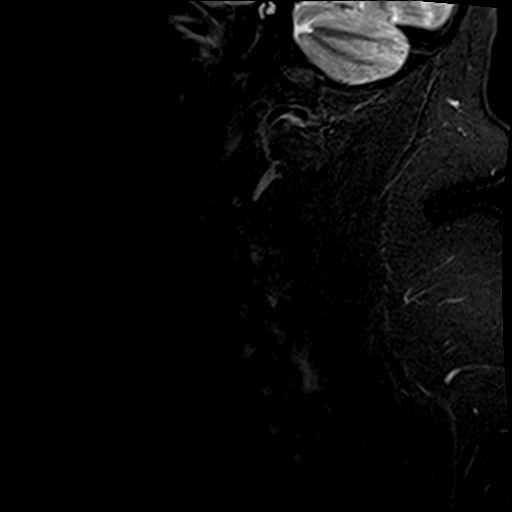
[im 3/15]
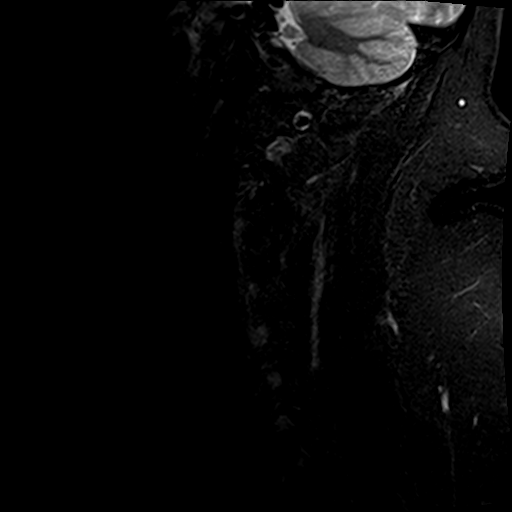
[im 5/15]
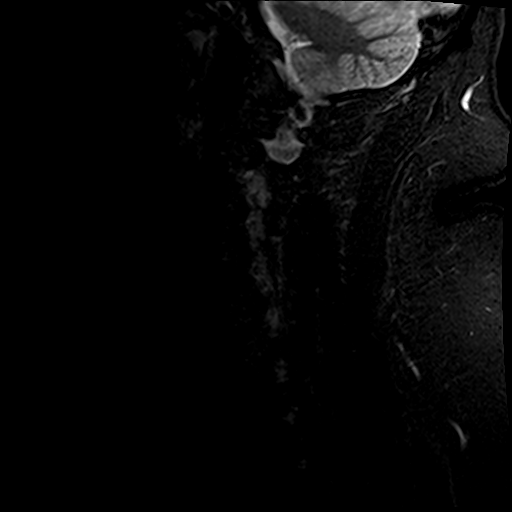
[im 8/15]
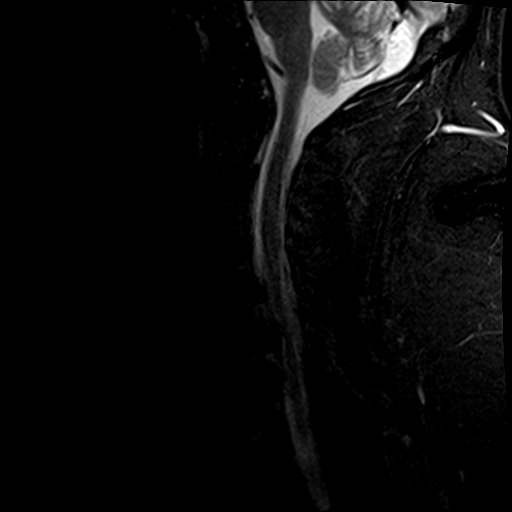
[im 10/15]
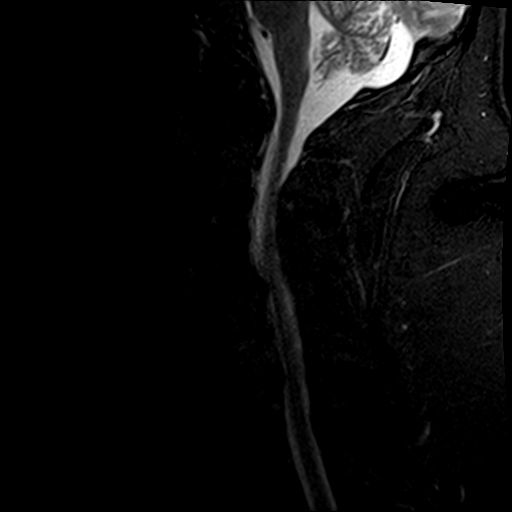
[im 12/15]
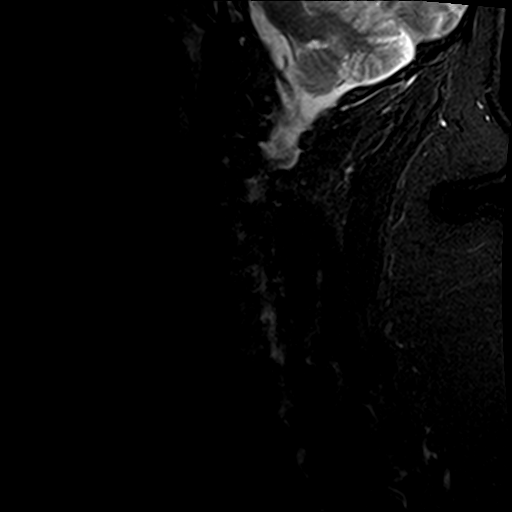

[Series 10: T2 · axial · 3.0mm · 0.70mm/px · z∈[-86,+29]mm · 8 of 32 slices shown (2 of 2)]
[im 1/32]
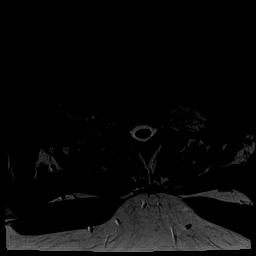
[im 5/32]
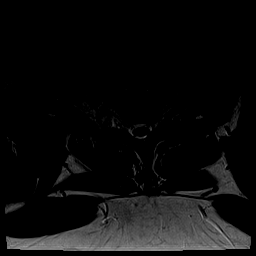
[im 10/32]
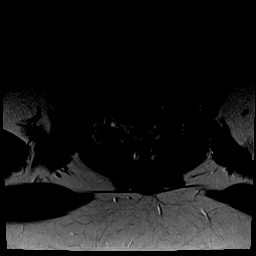
[im 15/32]
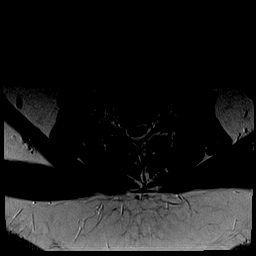
[im 17/32]
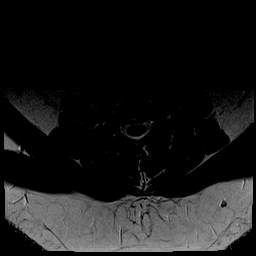
[im 22/32]
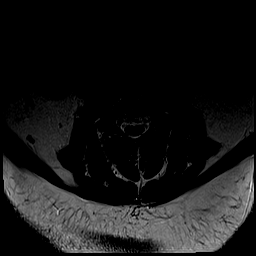
[im 27/32]
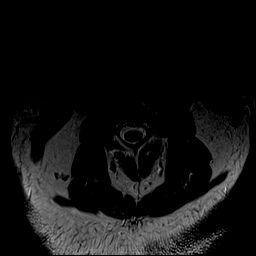
[im 32/32]
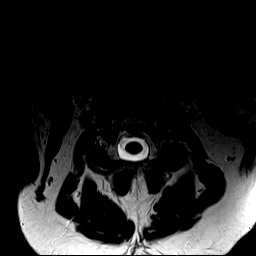

[27 of 48 positions shown; findings below may reference images not displayed]

FINDINGS: Alignment: Straightening of the normal cervical lordosis. Trace
anterolisthesis of C4 on C5.

Vertebrae: Vertebral body height maintained without acute or chronic
fracture. Bone marrow signal intensity within normal limits. No
discrete or worrisome osseous lesions. Discogenic reactive endplate
change present about the C4-5 through C6-7 interspaces with mild
marrow edema at the level of C5-6. No other abnormal marrow edema.

Cord: Normal signal and morphology.

Posterior Fossa, vertebral arteries, paraspinal tissues: Retro
cerebellar cyst versus mega cisterna magna noted at the posterior
fossa. Craniocervical junction within normal limits. Paraspinous and
prevertebral soft tissues normal. Normal flow voids seen within the
vertebral arteries bilaterally.

Disc levels:

C2-C3: Mild left-sided uncovertebral hypertrophy without significant
disc bulge. Mild left facet degeneration. No spinal stenosis. Mild
left C3 foraminal narrowing. Right neural foramina remains patent.

C3-C4: Minimal disc bulge with uncovertebral hypertrophy. Mild facet
hypertrophy. No spinal stenosis. Mild left greater than right C4
foraminal narrowing.

C4-C5: Trace anterolisthesis. Left paracentral disc osteophyte
complex indents the left ventral thecal sac, contacting and
flattening the left hemi cord. Mild spinal stenosis without cord
signal changes. Mild left C5 foraminal stenosis. Right neural
foramina remains patent.

C5-C6: Degenerative intervertebral disc space narrowing. Broad right
subarticular to foraminal disc osteophyte complex indents the right
ventral thecal sac (series 10, image 19). Secondary flattening of
the right ventral cord without cord signal changes. Mild spinal
stenosis with moderate right C6 foraminal narrowing. Left neural
foramina remains patent.

C6-C7: Central disc osteophyte complex indents the ventral thecal
sac. Mild cord flattening without cord signal changes. Mild spinal
stenosis. Superimposed left-sided uncovertebral spurring with mild
facet hypertrophy. Resultant moderate left C7 foraminal stenosis.
Right neural foramina remains patent.

C7-T1: Negative interspace. Mild left facet hypertrophy. No canal or
foraminal stenosis.

Visualized upper thoracic spine demonstrates no significant finding.
IMPRESSION: 1. Central disc osteophyte complex at C6-7 with resultant mild
spinal stenosis, with moderate left C7 foraminal narrowing.
2. Left paracentral disc osteophyte complex at C4-5 with resultant
mild canal and left C5 foraminal stenosis.
3. Broad right subarticular to foraminal disc osteophyte complex at
C5-6 with resultant mild canal and moderate right C6 foraminal
stenosis.

## 2022-12-28 ENCOUNTER — Encounter: Payer: Self-pay | Admitting: Family Medicine

## 2022-12-28 ENCOUNTER — Ambulatory Visit (INDEPENDENT_AMBULATORY_CARE_PROVIDER_SITE_OTHER): Payer: Medicaid Other | Admitting: Family Medicine

## 2022-12-28 VITALS — BP 143/99 | HR 79 | Temp 98.1°F | Resp 16 | Ht 72.75 in | Wt 327.8 lb

## 2022-12-28 DIAGNOSIS — I10 Essential (primary) hypertension: Secondary | ICD-10-CM | POA: Diagnosis not present

## 2022-12-28 DIAGNOSIS — F331 Major depressive disorder, recurrent, moderate: Secondary | ICD-10-CM

## 2022-12-28 DIAGNOSIS — E7849 Other hyperlipidemia: Secondary | ICD-10-CM | POA: Diagnosis not present

## 2022-12-28 DIAGNOSIS — F411 Generalized anxiety disorder: Secondary | ICD-10-CM | POA: Diagnosis not present

## 2022-12-28 DIAGNOSIS — R12 Heartburn: Secondary | ICD-10-CM | POA: Diagnosis not present

## 2022-12-28 MED ORDER — LOSARTAN POTASSIUM 25 MG PO TABS: 25.0000 mg | ORAL_TABLET | Freq: Every day | ORAL | 1 refills | Status: DC

## 2022-12-28 MED ORDER — SERTRALINE HCL 100 MG PO TABS: 200.0000 mg | ORAL_TABLET | Freq: Every day | ORAL | 0 refills | Status: DC

## 2022-12-28 MED ORDER — ATORVASTATIN CALCIUM 40 MG PO TABS: 40.0000 mg | ORAL_TABLET | Freq: Every day | ORAL | 5 refills | Status: DC

## 2022-12-28 MED ORDER — PANTOPRAZOLE SODIUM 40 MG PO TBEC: 40.0000 mg | DELAYED_RELEASE_TABLET | Freq: Every day | ORAL | 3 refills | Status: DC

## 2022-12-28 NOTE — Progress Notes (Unsigned)
Patient is here for their 6 month follow-up Patient has no concerns today Care gaps have been discussed with patient  

## 2022-12-29 ENCOUNTER — Ambulatory Visit: Payer: Medicaid Other | Admitting: Family Medicine

## 2022-12-29 ENCOUNTER — Ambulatory Visit: Payer: Medicaid Other

## 2022-12-30 ENCOUNTER — Encounter: Payer: Self-pay | Admitting: Family Medicine

## 2022-12-30 NOTE — Progress Notes (Signed)
Established Patient Office Visit  Subjective    Patient ID: Kevin Stephenson, male    DOB: 27-Jun-1976  Age: 47 y.o. MRN: [id number]  CC:  Chief Complaint  Patient presents with   Follow-up   Medication Refill    HPI Kevin Stephenson presents for routine follow up of chronic med issues. Patient reports that he has had financial and insurance issues and was not able to return to follow up as recommended nor get his meds. He has been out of his meds for several weeks/months.    Outpatient Encounter Medications as of 12/28/2022  Medication Sig   HYDROcodone-acetaminophen (NORCO) 7.5-325 MG tablet Take 1 tablet by mouth 2 (two) times daily.   ibuprofen (ADVIL) 600 MG tablet Take 1 tablet (600 mg total) by mouth every 8 (eight) hours as needed.   losartan (COZAAR) 25 MG tablet Take 1 tablet (25 mg total) by mouth daily.   meloxicam (MOBIC) 15 MG tablet Take 0.5-1 tablets (7.5-15 mg total) by mouth daily as needed for pain.   ondansetron (ZOFRAN) 4 MG tablet Take 1 tablet (4 mg total) by mouth every 6 (six) hours.   [DISCONTINUED] atorvastatin (LIPITOR) 40 MG tablet Take 1 tablet (40 mg total) by mouth daily.   [DISCONTINUED] pantoprazole (PROTONIX) 40 MG tablet TAKE 1 TABLET BY MOUTH EVERY DAY   [DISCONTINUED] sertraline (ZOLOFT) 100 MG tablet TAKE 2 TABLETS BY MOUTH EVERY DAY   Acetaminophen-Codeine 300-30 MG tablet Take 1 tablet by mouth every 6 (six) hours as needed. (Patient not taking: Reported on 07/28/2021)   atorvastatin (LIPITOR) 40 MG tablet Take 1 tablet (40 mg total) by mouth daily.   pantoprazole (PROTONIX) 40 MG tablet Take 1 tablet (40 mg total) by mouth daily.   sertraline (ZOLOFT) 100 MG tablet Take 2 tablets (200 mg total) by mouth daily.   No facility-administered encounter medications on file as of 12/28/2022.    History reviewed. No pertinent past medical history.  Past Surgical History:  Procedure Laterality Date   APPENDECTOMY     DENTAL SURGERY  08/2019   all  teeth removed    Family History  Problem Relation Age of Onset   Heart failure Father    Colon cancer Father    Heart disease Father     Social History   Socioeconomic History   Marital status: Married    Spouse name: Not on file   Number of children: 2   Years of education: 12   Highest education level: Not on file  Occupational History   Occupation: Truck Geophysicist/field seismologist  Tobacco Use   Smoking status: Former    Types: Cigarettes    Quit date: 2015    Years since quitting: 9.1   Smokeless tobacco: Never  Vaping Use   Vaping Use: Every day  Substance and Sexual Activity   Alcohol use: No   Drug use: No   Sexual activity: Not Currently  Other Topics Concern   Not on file  Social History Narrative   Not on file   Social Determinants of Health   Financial Resource Strain: Not on file  Food Insecurity: Not on file  Transportation Needs: Not on file  Physical Activity: Not on file  Stress: Not on file  Social Connections: Not on file  Intimate Partner Violence: Not on file    Review of Systems  All other systems reviewed and are negative.       Objective    BP (!) 143/99   Pulse 79  Temp 98.1 F (36.7 C) (Oral)   Resp 16   Ht 6' 0.75" (1.848 m)   Wt (!) 327 lb 12.8 oz (148.7 kg)   SpO2 97%   BMI 43.55 kg/m   Physical Exam Vitals and nursing note reviewed.  Constitutional:      General: He is not in acute distress.    Appearance: He is obese.  Cardiovascular:     Rate and Rhythm: Normal rate and regular rhythm.  Pulmonary:     Effort: Pulmonary effort is normal.     Breath sounds: Normal breath sounds.  Abdominal:     Palpations: Abdomen is soft.     Tenderness: There is no abdominal tenderness.  Musculoskeletal:     Right lower leg: No edema.     Left lower leg: No edema.  Neurological:     General: No focal deficit present.     Mental Status: He is alert and oriented to person, place, and time.  Psychiatric:        Mood and Affect: Mood is  anxious.         Assessment & Plan:   1. Essential hypertension Elevated readings. Discussed compliance. Meds refilled.   2. Heartburn Continue. Meds refilled.  - pantoprazole (PROTONIX) 40 MG tablet; Take 1 tablet (40 mg total) by mouth daily.  Dispense: 30 tablet; Refill: 3  3. Anxiety state Patient has been without meds and notes some decompensation. Meds refilled.  - sertraline (ZOLOFT) 100 MG tablet; Take 2 tablets (200 mg total) by mouth daily.  Dispense: 180 tablet; Refill: 0  4. Moderate episode of recurrent major depressive disorder (HCC) As above - sertraline (ZOLOFT) 100 MG tablet; Take 2 tablets (200 mg total) by mouth daily.  Dispense: 180 tablet; Refill: 0  5. Other hyperlipidemia Continue. Meds refilled.     Return in about 4 weeks (around 01/25/2023) for follow up.   Becky Sax, MD

## 2023-01-28 ENCOUNTER — Ambulatory Visit: Payer: Medicaid Other | Admitting: Family Medicine

## 2023-03-25 ENCOUNTER — Other Ambulatory Visit: Payer: Self-pay | Admitting: Family Medicine

## 2023-03-25 DIAGNOSIS — F411 Generalized anxiety disorder: Secondary | ICD-10-CM

## 2023-03-25 DIAGNOSIS — F331 Major depressive disorder, recurrent, moderate: Secondary | ICD-10-CM

## 2023-06-22 ENCOUNTER — Other Ambulatory Visit: Payer: Self-pay | Admitting: Family Medicine

## 2023-06-22 DIAGNOSIS — R12 Heartburn: Secondary | ICD-10-CM

## 2023-06-27 ENCOUNTER — Other Ambulatory Visit: Payer: Self-pay | Admitting: Family Medicine

## 2023-06-27 DIAGNOSIS — F331 Major depressive disorder, recurrent, moderate: Secondary | ICD-10-CM

## 2023-06-27 DIAGNOSIS — F411 Generalized anxiety disorder: Secondary | ICD-10-CM

## 2024-03-29 ENCOUNTER — Telehealth: Payer: Self-pay | Admitting: Family Medicine

## 2024-03-29 NOTE — Telephone Encounter (Signed)
 Called patient to bring patient in sooner no answer couldn't leave voicemail sent message in [place]

## 2024-04-11 ENCOUNTER — Encounter: Admitting: Family

## 2024-04-11 NOTE — Progress Notes (Signed)
 Erroneous encounter-disregard

## 2024-04-25 ENCOUNTER — Telehealth: Payer: Self-pay | Admitting: Family Medicine

## 2024-04-25 ENCOUNTER — Encounter: Admitting: Family

## 2024-04-25 NOTE — Progress Notes (Signed)
 Erroneous encounter-disregard

## 2024-04-25 NOTE — Telephone Encounter (Signed)
 Called pt and could not reach or leave vm to reschedule missed appt

## 2024-06-08 ENCOUNTER — Ambulatory Visit (INDEPENDENT_AMBULATORY_CARE_PROVIDER_SITE_OTHER): Admitting: Family Medicine

## 2024-06-08 ENCOUNTER — Encounter: Payer: Self-pay | Admitting: Family Medicine

## 2024-06-08 ENCOUNTER — Ambulatory Visit: Attending: Family Medicine

## 2024-06-08 ENCOUNTER — Other Ambulatory Visit (HOSPITAL_COMMUNITY): Payer: Self-pay

## 2024-06-08 ENCOUNTER — Ambulatory Visit: Payer: Self-pay | Admitting: Family Medicine

## 2024-06-08 VITALS — BP 135/89 | HR 72 | Ht 72.0 in | Wt 332.2 lb

## 2024-06-08 DIAGNOSIS — R5381 Other malaise: Secondary | ICD-10-CM

## 2024-06-08 DIAGNOSIS — I10 Essential (primary) hypertension: Secondary | ICD-10-CM

## 2024-06-08 DIAGNOSIS — R55 Syncope and collapse: Secondary | ICD-10-CM

## 2024-06-08 MED ORDER — LOSARTAN POTASSIUM 25 MG PO TABS
25.0000 mg | ORAL_TABLET | Freq: Every day | ORAL | 0 refills | Status: DC
  Filled 2024-06-08: qty 90, 90d supply, fill #0

## 2024-06-08 MED ORDER — ATORVASTATIN CALCIUM 40 MG PO TABS
40.0000 mg | ORAL_TABLET | Freq: Every day | ORAL | 0 refills | Status: DC
  Filled 2024-06-08: qty 90, 90d supply, fill #0

## 2024-06-08 NOTE — Progress Notes (Unsigned)
 EP to read.

## 2024-06-08 NOTE — Progress Notes (Unsigned)
 Established Patient Office Visit  Subjective    Patient ID: Kevin Stephenson, male    DOB: [birth year]  Age: 48 y.o. MRN: [id number]  CC:  Chief Complaint  Patient presents with   Medical Management of Chronic Issues    HPI Kevin Stephenson presents for follow up of hypertension. Patient also reports an episode of nearly passing out and general malaise. Patient reports symptoms have been for weeks/months but appears to be persistent and possibly worsening.   Outpatient Encounter Medications as of 06/08/2024  Medication Sig   Acetaminophen-Codeine 300-30 MG tablet Take 1 tablet by mouth every 6 (six) hours as needed. (Patient not taking: Reported on 07/28/2021)   atorvastatin (LIPITOR) 40 MG tablet Take 1 tablet (40 mg total) by mouth daily.   HYDROcodone-acetaminophen (NORCO) 7.5-325 MG tablet Take 1 tablet by mouth 2 (two) times daily.   ibuprofen (ADVIL) 600 MG tablet Take 1 tablet (600 mg total) by mouth every 8 (eight) hours as needed.   losartan (COZAAR) 25 MG tablet Take 1 tablet (25 mg total) by mouth daily.   meloxicam (MOBIC) 15 MG tablet Take 0.5-1 tablets (7.5-15 mg total) by mouth daily as needed for pain.   ondansetron (ZOFRAN) 4 MG tablet Take 1 tablet (4 mg total) by mouth every 6 (six) hours.   sertraline (ZOLOFT) 100 MG tablet TAKE 2 TABLETS BY MOUTH EVERY DAY   [DISCONTINUED] atorvastatin (LIPITOR) 40 MG tablet Take 1 tablet (40 mg total) by mouth daily.   [DISCONTINUED] losartan (COZAAR) 25 MG tablet Take 1 tablet (25 mg total) by mouth daily.   [DISCONTINUED] pantoprazole (PROTONIX) 40 MG tablet Take 1 tablet (40 mg total) by mouth daily.   No facility-administered encounter medications on file as of 06/08/2024.    History reviewed. No pertinent past medical history.  Past Surgical History:  Procedure Laterality Date   APPENDECTOMY     DENTAL SURGERY  08/2019   all teeth removed    Family History  Problem Relation Age of Onset   Heart failure Father     Colon cancer Father    Heart disease Father     Social History   Socioeconomic History   Marital status: Married    Spouse name: Not on file   Number of children: 2   Years of education: 12   Highest education level: 12th grade  Occupational History   Occupation: [employer] doctor  Tobacco Use   Smoking status: Former    Current packs/day: 0.00    Types: Cigarettes    Quit date: 2015    Years since quitting: 10.6   Smokeless tobacco: Never  Vaping Use   Vaping status: Every Day  Substance and Sexual Activity   Alcohol use: No   Drug use: No   Sexual activity: Not Currently  Other Topics Concern   Not on file  Social History Narrative   Not on file   Social Drivers of Health   Financial Resource Strain: Medium Risk (06/06/2024)   Overall Financial Resource Strain (CARDIA)    Difficulty of Paying Living Expenses: Somewhat hard  Food Insecurity: Food Insecurity Present (06/06/2024)   Hunger Vital Sign    Worried About Running Out of Food in the Last Year: Often true    Ran Out of Food in the Last Year: Sometimes true  Transportation Needs: No Transportation Needs (06/06/2024)   PRAPARE - Administrator, Civil Service (Medical): No    Lack of Transportation (Non-Medical): No  Physical Activity: Insufficiently  Active (06/06/2024)   Exercise Vital Sign    Days of Exercise per Week: 4 days    Minutes of Exercise per Session: 30 min  Stress: Stress Concern Present (06/06/2024)   [employer] of Occupational Health - Occupational Stress Questionnaire    Feeling of Stress: To some extent  Social Connections: Unknown (06/06/2024)   Social Connection and Isolation Panel    Frequency of Communication with Friends and Family: Twice a week    Frequency of Social Gatherings with Friends and Family: Patient declined    Attends Religious Services: 1 to 4 times per year    Active Member of [employer] or Organizations: No    Attends Engineer, structural: Not on file     Marital Status: Married  Catering manager Violence: Not on file    Review of Systems  Constitutional:  Positive for malaise/fatigue.  All other systems reviewed and are negative.       Objective    BP 135/89   Pulse 72   Ht 6' (1.829 m)   Wt (!) 332 lb 3.2 oz (150.7 kg)   SpO2 96%   BMI 45.05 kg/m   Physical Exam Vitals and nursing note reviewed.  Constitutional:      General: He is not in acute distress.    Appearance: He is obese.  Cardiovascular:     Rate and Rhythm: Normal rate and regular rhythm.  Pulmonary:     Effort: Pulmonary effort is normal.     Breath sounds: Normal breath sounds.  Abdominal:     Palpations: Abdomen is soft.     Tenderness: There is no abdominal tenderness.  Musculoskeletal:     Right lower leg: No edema.     Left lower leg: No edema.  Neurological:     General: No focal deficit present.     Mental Status: He is alert and oriented to person, place, and time.  Psychiatric:        Mood and Affect: Mood is anxious.         Assessment & Plan:   Near syncope -     EKG 12-Lead -     LONG TERM MONITOR (3-14 DAYS); Future  Essential hypertension  Malaise -     EKG 12-Lead  Other orders -     Losartan Potassium; Take 1 tablet (25 mg total) by mouth daily.  Dispense: 90 tablet; Refill: 0 -     Atorvastatin Calcium; Take 1 tablet (40 mg total) by mouth daily.  Dispense: 90 tablet; Refill: 0     Return in about 4 weeks (around 07/06/2024) for follow up.   Tanda Raguel SQUIBB, MD

## 2024-06-12 ENCOUNTER — Other Ambulatory Visit (HOSPITAL_COMMUNITY): Payer: Self-pay

## 2024-06-12 ENCOUNTER — Other Ambulatory Visit: Payer: Self-pay | Admitting: Family Medicine

## 2024-06-12 DIAGNOSIS — R12 Heartburn: Secondary | ICD-10-CM

## 2024-06-12 MED ORDER — PANTOPRAZOLE SODIUM 40 MG PO TBEC
40.0000 mg | DELAYED_RELEASE_TABLET | Freq: Every day | ORAL | 1 refills | Status: DC
  Filled 2024-06-12: qty 30, 30d supply, fill #0

## 2024-06-21 ENCOUNTER — Other Ambulatory Visit (HOSPITAL_COMMUNITY): Payer: Self-pay

## 2024-07-10 ENCOUNTER — Ambulatory Visit: Admitting: Family Medicine

## 2024-07-12 ENCOUNTER — Encounter: Payer: Self-pay | Admitting: Family Medicine

## 2024-07-12 ENCOUNTER — Other Ambulatory Visit (HOSPITAL_COMMUNITY): Payer: Self-pay

## 2024-07-12 ENCOUNTER — Ambulatory Visit (INDEPENDENT_AMBULATORY_CARE_PROVIDER_SITE_OTHER): Admitting: Family Medicine

## 2024-07-12 VITALS — BP 139/90 | Ht 72.0 in | Wt 325.8 lb

## 2024-07-12 DIAGNOSIS — R42 Dizziness and giddiness: Secondary | ICD-10-CM

## 2024-07-12 DIAGNOSIS — K219 Gastro-esophageal reflux disease without esophagitis: Secondary | ICD-10-CM | POA: Diagnosis not present

## 2024-07-12 DIAGNOSIS — I10 Essential (primary) hypertension: Secondary | ICD-10-CM | POA: Diagnosis not present

## 2024-07-12 DIAGNOSIS — R55 Syncope and collapse: Secondary | ICD-10-CM | POA: Diagnosis not present

## 2024-07-12 MED ORDER — PANTOPRAZOLE SODIUM 40 MG PO TBEC
40.0000 mg | DELAYED_RELEASE_TABLET | Freq: Every day | ORAL | 1 refills | Status: AC
  Filled 2024-07-12: qty 90, 90d supply, fill #0

## 2024-07-12 NOTE — Progress Notes (Unsigned)
 Cardiology Office Note    Date:  07/13/2024  ID:  Kevin Stephenson, DOB 24-Jan-1976, MRN [id number] PCP:  Tanda Bleacher, MD  Cardiologist:  None - New  Chief Complaint: dizziness, DOE, episodic chest burning  History of Present Illness: .    Kevin Stephenson is a 48 y.o. male with visit-pertinent history of HTN, HLD managed by PCP, former tobacco use, episodic THC use, strong family history of premature CAD, anxiety, depression, heartburn, obesity seen for evaluation of near-syncope at the request of Dr. Tanda.  About a month ago he had two episodes of near-syncope described as fading out without full LOC that occurred on hot days working on his car outside. The actual near-syncope happened once he was back inside, I.e. about to eat dinner. He did not fully pass out. Symptoms passed without needing to seek urgent intervention. During this timeframe he has also developed the sensation that he is constantly weighed down by something with vague dizziness. He has also had associated with SOB/DOE and easy fatigability. He has also developed 5-6 episodes of nocturnal chest burning lasting several minutes that eases off without specific pattern. He has not had any exertional chest burning. However, he has not felt like himself in over a month.   2 week monitor was ordered by PCP: Patient had a min HR of 42 bpm, max HR of 166 bpm, and avg HR of 75 bpm. Predominant underlying rhythm was Sinus Rhythm. First Degree AV Block was present. 1 run of Supraventricular Tachycardia occurred lasting 6 beats with a max rate of 114 bpm (avg 98 bpm). Second Degree AV Block-Mobitz I (Wenckebach) was present. Isolated SVEs were rare (<1.0%), SVE Couplets were rare (<1.0%), and no SVE Triplets were present. No Isolated VEs, VE Couplets, or VE Triplets were present. Episode of 2nd degree type 1 AVB occurred at 6:35am without sustained pause or bradycardia. He had dizziness during the duration of this monitor as well,  without clear correlation to HR abnormality. He felt dizzy most of yesterday, but today this feels somewhat better. No acute CP today.  Orthostatic VS were done which were negative: Orthostatic VS Lying BP 126/84 HR 70 Sitting BP 129/86 HR 74 Standing 37m BP 127/83 HR 82 Standing 33m BP 132/84 HR 84   Family History: dad had first cardiac event in early 80's with CABG around age 73, died of MI at 54; mother's side had an uncle who had multiple stents in this 80s Tobacco: former tobacco x 25 years, quit but now rare vaping Alcohol: none Drug use: rare THC use  Labwork independently reviewed: No recent labs 2023 K 4.0, Cr 1.22, AST 58, ALT 75, Hgb 16.1, plt 373  ROS: .    Please see the history of present illness.  All other systems are reviewed and otherwise negative.  Studies Reviewed: SABRA    EKG:  EKG is ordered today and personally reviewed:  EKG Interpretation Date/Time:  Thursday July 13 2024 08:23:47 EDT Ventricular Rate:  69 PR Interval:  202 QRS Duration:  94 QT Interval:  384 QTC Calculation: 411 R Axis:   2  Text Interpretation: Normal sinus rhythm Normal ECG No previous ECGs available Confirmed by Brooklyne Radke [phone]) on 07/13/2024 8:25:21 AM    CV Studies: Cardiac studies reviewed are outlined and summarized above. Otherwise please see EMR for full report.   Current Reported Medications:.    Current Meds  Medication Sig   atorvastatin  (LIPITOR) 40 MG tablet Take 1 tablet (40 mg total)  by mouth daily.   losartan  (COZAAR ) 25 MG tablet Take 1 tablet (25 mg total) by mouth daily.   pantoprazole  (PROTONIX ) 40 MG tablet Take 1 tablet (40 mg total) by mouth daily.    Physical Exam:    VS:  BP 134/86   Pulse 74   Ht 6' 2 (1.88 m)   Wt (!) 332 lb 6.4 oz (150.8 kg)   SpO2 99%   BMI 42.68 kg/m    Wt Readings from Last 3 Encounters:  07/13/24 (!) 332 lb 6.4 oz (150.8 kg)  07/12/24 (!) 325 lb 12.8 oz (147.8 kg)  06/08/24 (!) 332 lb 3.2 oz (150.7 kg)     GEN: Well nourished, well developed in no acute distress NECK: No JVD; No carotid bruits CARDIAC: RRR, no murmurs, rubs, gallops RESPIRATORY:  Clear to auscultation without rales, wheezing or rhonchi  ABDOMEN: Soft, non-tender, non-distended EXTREMITIES:  No edema; No acute deformity   Asessement and Plan:.    1. Dizziness/near-syncope with SOB/DOE and episodic nocturnal chest burning - concerning constellation of symptoms. Not certain we can attribute his dizziness to an underlying cardiac issue, but I am concerned about his exertional dyspnea and vague episodic nocturnal chest burning in the context of his risk factors. He has not had any exertional chest pain otherwise. EKG unrevealing. No recent labs or neurologic imaging available. Discussed with Dr. Wonda who felt cardiac cath vs coronary CTA would both be appropriate options. Given family history of premature CAD, former tobacco abuse, possible familial HLD, he has a fairly high pretest probability for underlying coronary disease. I discussed options with patient who is nervous to proceed with cardiac cath and would like to pursue coronary CTA to start. He understands that if coronary CTA is worrisome for obstructive disease, we will then move to cardiac catheterization. Will also obtain echocardiogram. Since we cannot easily explain the dizziness from a rhythm/vitals standpoint, will also obtain CT head noncontrasted to ensure no smoldering intracranial process. Check CMET, lipids, direct LDL, TSH, CBC today. Add ASA 81mg  daily. ER precautions reviewed. Consider carotid duplex in follow-up if above workup unrevealing.  2. Essential HTN - SBP slightly above goal but in setting of ongoing dizziness, will hold off on acute adjustment pending outcome of studies above.  3. 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 Consider eval for OSA once acute workup completed.  4. HLD - ? Familial HLD. Check lipid panel with direct LDL today.    Disposition: F/u with me in 6 weeks.  Signed, Brayam Boeke N Zian Mohamed, PA-C

## 2024-07-12 NOTE — Progress Notes (Unsigned)
 Established Patient Office Visit  Subjective    Patient ID: Kevin Stephenson, male    DOB: September 24, 1976  Age: 48 y.o. MRN: [id number]  CC:  Chief Complaint  Patient presents with   Medical Management of Chronic Issues    Pt is here for results on cardiac monitor     HPI [name] presents for follow up of recent zio patch. He reports that he continues to have dizziness and near syncope.   Outpatient Encounter Medications as of 07/12/2024  Medication Sig   atorvastatin  (LIPITOR) 40 MG tablet Take 1 tablet (40 mg total) by mouth daily.   losartan  (COZAAR ) 25 MG tablet Take 1 tablet (25 mg total) by mouth daily.   Acetaminophen -Codeine  300-30 MG tablet Take 1 tablet by mouth every 6 (six) hours as needed. (Patient not taking: Reported on 07/13/2024)   HYDROcodone -acetaminophen  (NORCO) 7.5-325 MG tablet Take 1 tablet by mouth 2 (two) times daily. (Patient not taking: Reported on 07/13/2024)   ibuprofen  (ADVIL ) 600 MG tablet Take 1 tablet (600 mg total) by mouth every 8 (eight) hours as needed. (Patient not taking: Reported on 07/13/2024)   meloxicam  (MOBIC ) 15 MG tablet Take 0.5-1 tablets (7.5-15 mg total) by mouth daily as needed for pain. (Patient not taking: Reported on 07/13/2024)   ondansetron  (ZOFRAN ) 4 MG tablet Take 1 tablet (4 mg total) by mouth every 6 (six) hours. (Patient not taking: Reported on 07/13/2024)   pantoprazole  (PROTONIX ) 40 MG tablet Take 1 tablet (40 mg total) by mouth daily.   sertraline  (ZOLOFT ) 100 MG tablet TAKE 2 TABLETS BY MOUTH EVERY DAY (Patient not taking: Reported on 07/13/2024)   [DISCONTINUED] pantoprazole  (PROTONIX ) 40 MG tablet Take 1 tablet (40 mg total) by mouth daily.   No facility-administered encounter medications on file as of 07/12/2024.    History reviewed. No pertinent past medical history.  Past Surgical History:  Procedure Laterality Date   APPENDECTOMY     DENTAL SURGERY  08/2019   all teeth removed    Family History  Problem  Relation Age of Onset   Heart failure Father    Colon cancer Father    Heart disease Father     Social History   Socioeconomic History   Marital status: Married    Spouse name: Not on file   Number of children: 2   Years of education: 12   Highest education level: 12th grade  Occupational History   Occupation: [employer] doctor  Tobacco Use   Smoking status: Former    Current packs/day: 0.00    Types: Cigarettes    Quit date: 2015    Years since quitting: 10.7   Smokeless tobacco: Never  Vaping Use   Vaping status: Every Day  Substance and Sexual Activity   Alcohol use: No   Drug use: No   Sexual activity: Not Currently  Other Topics Concern   Not on file  Social History Narrative   Not on file   Social Drivers of Health   Financial Resource Strain: Medium Risk (06/06/2024)   Overall Financial Resource Strain (CARDIA)    Difficulty of Paying Living Expenses: Somewhat hard  Food Insecurity: Food Insecurity Present (06/06/2024)   Hunger Vital Sign    Worried About Running Out of Food in the Last Year: Often true    Ran Out of Food in the Last Year: Sometimes true  Transportation Needs: No Transportation Needs (06/06/2024)   PRAPARE - Administrator, Civil Service (Medical): No  Lack of Transportation (Non-Medical): No  Physical Activity: Insufficiently Active (06/06/2024)   Exercise Vital Sign    Days of Exercise per Week: 4 days    Minutes of Exercise per Session: 30 min  Stress: Stress Concern Present (06/06/2024)   [employer] of Occupational Health - Occupational Stress Questionnaire    Feeling of Stress: To some extent  Social Connections: Unknown (06/06/2024)   Social Connection and Isolation Panel    Frequency of Communication with Friends and Family: Twice a week    Frequency of Social Gatherings with Friends and Family: Patient declined    Attends Religious Services: 1 to 4 times per year    Active Member of [employer] or Organizations: No     Attends Engineer, structural: Not on file    Marital Status: Married  Catering manager Violence: Not on file    Review of Systems  Cardiovascular:  Positive for palpitations.  Neurological:  Positive for dizziness.  All other systems reviewed and are negative.       Objective    BP (!) 139/90   Ht 6' (1.829 m)   Wt (!) 325 lb 12.8 oz (147.8 kg)   SpO2 95%   BMI 44.19 kg/m   Physical Exam Vitals and nursing note reviewed.  Constitutional:      General: He is not in acute distress.    Appearance: He is obese.  Cardiovascular:     Rate and Rhythm: Normal rate and regular rhythm.  Pulmonary:     Effort: Pulmonary effort is normal.     Breath sounds: Normal breath sounds.  Abdominal:     Palpations: Abdomen is soft.     Tenderness: There is no abdominal tenderness.  Musculoskeletal:     Right lower leg: No edema.     Left lower leg: No edema.  Neurological:     General: No focal deficit present.     Mental Status: He is alert and oriented to person, place, and time.  Psychiatric:        Mood and Affect: Mood is anxious.         Assessment & Plan:   Near syncope -     Ambulatory referral to Cardiology  Dizziness and giddiness -     Ambulatory referral to Cardiology  Gastroesophageal reflux disease without esophagitis  Essential hypertension  Other orders -     Pantoprazole  Sodium; Take 1 tablet (40 mg total) by mouth daily.  Dispense: 90 tablet; Refill: 1     Return if symptoms worsen or fail to improve.   Tanda Raguel SQUIBB, MD

## 2024-07-13 ENCOUNTER — Encounter: Payer: Self-pay | Admitting: Family Medicine

## 2024-07-13 ENCOUNTER — Other Ambulatory Visit (HOSPITAL_COMMUNITY): Payer: Self-pay

## 2024-07-13 ENCOUNTER — Encounter: Payer: Self-pay | Admitting: Physician Assistant

## 2024-07-13 ENCOUNTER — Ambulatory Visit: Attending: Physician Assistant | Admitting: Physician Assistant

## 2024-07-13 VITALS — BP 134/86 | HR 74 | Ht 74.0 in | Wt 332.4 lb

## 2024-07-13 DIAGNOSIS — I441 Atrioventricular block, second degree: Secondary | ICD-10-CM | POA: Diagnosis present

## 2024-07-13 DIAGNOSIS — R0600 Dyspnea, unspecified: Secondary | ICD-10-CM | POA: Diagnosis present

## 2024-07-13 DIAGNOSIS — R42 Dizziness and giddiness: Secondary | ICD-10-CM | POA: Insufficient documentation

## 2024-07-13 DIAGNOSIS — H81399 Other peripheral vertigo, unspecified ear: Secondary | ICD-10-CM | POA: Diagnosis present

## 2024-07-13 DIAGNOSIS — I10 Essential (primary) hypertension: Secondary | ICD-10-CM | POA: Insufficient documentation

## 2024-07-13 DIAGNOSIS — R55 Syncope and collapse: Secondary | ICD-10-CM

## 2024-07-13 DIAGNOSIS — R072 Precordial pain: Secondary | ICD-10-CM | POA: Insufficient documentation

## 2024-07-13 LAB — CBC

## 2024-07-13 LAB — LIPID PANEL

## 2024-07-13 MED ORDER — METOPROLOL TARTRATE 100 MG PO TABS
100.0000 mg | ORAL_TABLET | ORAL | 0 refills | Status: DC
  Filled 2024-07-13: qty 1, 1d supply, fill #0

## 2024-07-13 MED ORDER — ASPIRIN 81 MG PO TBEC: 81.0000 mg | DELAYED_RELEASE_TABLET | Freq: Every day | ORAL | Status: AC

## 2024-07-13 NOTE — Patient Instructions (Signed)
 Medication Instructions:  Your physician has recommended you make the following change in your medication:   START Aspirin  81 mg taking 1 daily   *If you need a refill on your cardiac medications before your next appointment, please call your pharmacy*  Lab Work: TODAY:  CMET, CBC, TSH, & LIPID  If you have labs (blood work) drawn today and your tests are completely normal, you will receive your results only by: MyChart Message (if you have MyChart) OR A paper copy in the mail If you have any lab test that is abnormal or we need to change your treatment, we will call you to review the results.  Testing/Procedures: Your physician has requested that you have an echocardiogram. Echocardiography is a painless test that uses sound waves to create images of your heart. It provides your doctor with information about the size and shape of your heart and how well your heart's chambers and valves are working. This procedure takes approximately one hour. There are no restrictions for this procedure. Please do NOT wear cologne, perfume, aftershave, or lotions (deodorant is allowed). Please arrive 15 minutes prior to your appointment time.  Please note: We ask at that you not bring children with you during ultrasound (echo/ vascular) testing. Due to room size and safety concerns, children are not allowed in the ultrasound rooms during exams. Our front office staff cannot provide observation of children in our lobby area while testing is being conducted. An adult accompanying a patient to their appointment will only be allowed in the ultrasound room at the discretion of the ultrasound technician under special circumstances. We apologize for any inconvenience.   Your physician recommends you have a CT Head ASAP.  Non-Cardiac CT Angiography (CTA), is a special type of CT scan that uses a computer to produce multi-dimensional views of major blood vessels throughout the body. In CT angiography, a contrast  material is injected through an IV to help visualize the blood vessels   Your physician has requested that you have cardiac CT. Cardiac computed tomography (CT) is a painless test that uses an x-ray machine to take clear, detailed pictures of your heart. For further information please visit [email or web address] Please follow instruction sheet as  BELOW:    Your cardiac CT will be scheduled at one of the below locations:   [facility] [address] [place], [place] [address] [phone] (Severe contrast allergies only)  OR   [facility] [address] [place], [place] [address] [phone]  OR   [facility] [address] [place], [place] [address] [phone]  OR   [facility] [address]  [place], [place] [address] [phone]  OR   [facility] [address] [place], [place] [phone]  If scheduled at [facility], please arrive at the [facility] and Children's Entrance (Entrance C2) of [facility] 30 minutes prior to test start time. You can use the FREE valet parking offered at entrance C (encouraged to control the heart rate for the test)  Proceed to the [facility] Radiology Department (first floor) to check-in and test prep.  All radiology patients and guests should use entrance C2 at [facility], accessed from [facility], even though the hospital's physical address listed is [address]  If scheduled at the Heart and Vascular Tower at [address], please enter the parking lot using the [address] entrance and use the  FREE valet service at the patient drop-off area. Enter the building and check-in with registration on the main floor.  If scheduled at [facility], please arrive to the Heart and Vascular Center 15 mins early for check-in and test prep.  There is spacious parking and easy  access to the radiology department from the [facility] Heart and Vascular entrance. Please enter here and check-in with the desk attendant.   If scheduled at [facility], please arrive 30 minutes early for check-in and test prep.  Please follow these instructions carefully (unless otherwise directed):  An IV will be required for this test and Nitroglycerin will be given.  Hold all erectile dysfunction medications at least 3 days (72 hrs) prior to test. (Ie viagra, cialis, sildenafil, tadalafil, etc)   On the Night Before the Test: Be sure to Drink plenty of water. Do not consume any caffeinated/decaffeinated beverages or chocolate 12 hours prior to your test. Do not take any antihistamines 12 hours prior to your test.  On the Day of the Test: Drink plenty of water until 1 hour prior to the test. Do not eat any food 1 hour prior to test. You may take your regular medications prior to the test.  Take metoprolol  (Lopressor ) 100 MG two hours prior to test. THIS HAS BEEN SENT TO THE PHARMACY DOWNSTAIRS       After the Test: Drink plenty of water. After receiving IV contrast, you may experience a mild flushed feeling. This is normal. On occasion, you may experience a mild rash up to 24 hours after the test. This is not dangerous. If this occurs, you can take Benadryl 25 mg, Zyrtec, Claritin, or Allegra and increase your fluid intake. (Patients taking Tikosyn should avoid Benadryl, and may take Zyrtec, Claritin, or Allegra) If you experience trouble breathing, this can be serious. If it is severe call 911 IMMEDIATELY. If it is mild, please call our office.  We will call to schedule your test 2-4 weeks out understanding that some insurance companies will need an authorization prior to the service being performed.   For more information and frequently asked questions, please visit our website : [email or web address]  For non-scheduling related questions, please contact the  cardiac imaging nurse navigator should you have any questions/concerns: Cardiac Imaging Nurse Navigators Direct Office Dial: [phone]   For scheduling needs, including cancellations and rescheduling, please call [place], [phone].   Follow-Up: At [facility], you and your health needs are our priority.  As part of our continuing mission to provide you with exceptional heart care, our providers are all part of one team.  This team includes your primary Cardiologist (physician) and Advanced Practice Providers or APPs (Physician Assistants and Nurse Practitioners) who all work together to provide you with the care you need, when you need it.  Your next appointment:   6 week(s)  Provider:   Dayna Dunn, PA-C          We recommend signing up for the patient portal called MyChart.  Sign up information is provided on this After Visit Summary.  MyChart is used to connect with patients for Virtual Visits (Telemedicine).  Patients are able to view lab/test results, encounter notes, upcoming appointments, etc.  Non-urgent messages can be sent to your provider as well.   To learn more about what you can do with MyChart, go to [email or web address].   Other Instructions

## 2024-07-14 ENCOUNTER — Encounter (HOSPITAL_COMMUNITY): Payer: Self-pay

## 2024-07-14 ENCOUNTER — Ambulatory Visit: Payer: Self-pay | Admitting: Physician Assistant

## 2024-07-14 ENCOUNTER — Other Ambulatory Visit (HOSPITAL_COMMUNITY): Payer: Self-pay

## 2024-07-14 ENCOUNTER — Other Ambulatory Visit: Payer: Self-pay

## 2024-07-14 DIAGNOSIS — I2510 Atherosclerotic heart disease of native coronary artery without angina pectoris: Secondary | ICD-10-CM

## 2024-07-14 DIAGNOSIS — R42 Dizziness and giddiness: Secondary | ICD-10-CM

## 2024-07-14 DIAGNOSIS — R0600 Dyspnea, unspecified: Secondary | ICD-10-CM

## 2024-07-14 DIAGNOSIS — I10 Essential (primary) hypertension: Secondary | ICD-10-CM

## 2024-07-14 LAB — COMPREHENSIVE METABOLIC PANEL WITH GFR
ALT: 56 IU/L — ABNORMAL HIGH (ref 0–44)
AST: 31 IU/L (ref 0–40)
Albumin: 4.4 g/dL (ref 4.1–5.1)
Alkaline Phosphatase: 61 IU/L (ref 47–123)
BUN/Creatinine Ratio: 11 (ref 9–20)
BUN: 11 mg/dL (ref 6–24)
Bilirubin Total: 0.5 mg/dL (ref 0.0–1.2)
CO2: 21 mmol/L (ref 20–29)
Calcium: 9.9 mg/dL (ref 8.7–10.2)
Chloride: 103 mmol/L (ref 96–106)
Creatinine, Ser: 0.98 mg/dL (ref 0.76–1.27)
Globulin, Total: 2.7 g/dL (ref 1.5–4.5)
Glucose: 93 mg/dL (ref 70–99)
Potassium: 4.1 mmol/L (ref 3.5–5.2)
Sodium: 141 mmol/L (ref 134–144)
Total Protein: 7.1 g/dL (ref 6.0–8.5)
eGFR: 95 mL/min/1.73 (ref >59)

## 2024-07-14 LAB — CBC
Hematocrit: 46.4 % (ref 37.5–51.0)
Hemoglobin: 15.7 g/dL (ref 13.0–17.7)
MCH: 30.9 pg (ref 26.6–33.0)
MCHC: 33.8 g/dL (ref 31.5–35.7)
MCV: 91 fL (ref 79–97)
Platelets: 332 x10E3/uL (ref 150–450)
RBC: 5.08 x10E6/uL (ref 4.14–5.80)
RDW: 13.4 % (ref 11.6–15.4)
WBC: 7.8 x10E3/uL (ref 3.4–10.8)

## 2024-07-14 LAB — LIPID PANEL
Cholesterol, Total: 147 mg/dL (ref 100–199)
HDL: 38 mg/dL — AB (ref >39)
LDL CALC COMMENT:: 3.9 ratio (ref 0.0–5.0)
LDL Chol Calc (NIH): 85 mg/dL (ref 0–99)
Triglycerides: 134 mg/dL (ref 0–149)
VLDL Cholesterol Cal: 24 mg/dL (ref 5–40)

## 2024-07-14 LAB — TSH: TSH: 1.84 u[IU]/mL (ref 0.450–4.500)

## 2024-07-14 MED ORDER — ATORVASTATIN CALCIUM 80 MG PO TABS
80.0000 mg | ORAL_TABLET | Freq: Every day | ORAL | 3 refills | Status: AC
  Filled 2024-07-14: qty 90, 90d supply, fill #0

## 2024-07-14 NOTE — Telephone Encounter (Signed)
Patient is returning call to review results.

## 2024-07-18 ENCOUNTER — Ambulatory Visit (HOSPITAL_COMMUNITY)
Admission: RE | Admit: 2024-07-18 | Discharge: 2024-07-18 | Disposition: A | Discharge status: Home / Self Care | Source: Ambulatory Visit | Attending: Physician Assistant | Admitting: Physician Assistant

## 2024-07-18 DIAGNOSIS — I2584 Coronary atherosclerosis due to calcified coronary lesion: Secondary | ICD-10-CM | POA: Diagnosis not present

## 2024-07-18 DIAGNOSIS — R072 Precordial pain: Secondary | ICD-10-CM | POA: Diagnosis present

## 2024-07-18 DIAGNOSIS — I700 Atherosclerosis of aorta: Secondary | ICD-10-CM | POA: Insufficient documentation

## 2024-07-18 DIAGNOSIS — I2510 Atherosclerotic heart disease of native coronary artery without angina pectoris: Secondary | ICD-10-CM | POA: Diagnosis not present

## 2024-07-18 MED ORDER — NITROGLYCERIN 0.4 MG SL SUBL
0.8000 mg | SUBLINGUAL_TABLET | Freq: Once | SUBLINGUAL | Status: AC
  Administered 2024-07-18: 0.8 mg via SUBLINGUAL

## 2024-07-18 MED ORDER — IOHEXOL 350 MG/ML SOLN
100.0000 mL | Freq: Once | INTRAVENOUS | Status: AC | PRN
  Administered 2024-07-18: 100 mL via INTRAVENOUS

## 2024-07-19 ENCOUNTER — Ambulatory Visit (HOSPITAL_BASED_OUTPATIENT_CLINIC_OR_DEPARTMENT_OTHER)
Admission: RE | Admit: 2024-07-19 | Discharge: 2024-07-19 | Disposition: A | Discharge status: Home / Self Care | Source: Ambulatory Visit | Attending: Cardiology | Admitting: Cardiology

## 2024-07-19 ENCOUNTER — Other Ambulatory Visit: Payer: Self-pay | Admitting: Cardiology

## 2024-07-19 DIAGNOSIS — R931 Abnormal findings on diagnostic imaging of heart and coronary circulation: Secondary | ICD-10-CM | POA: Diagnosis not present

## 2024-07-19 DIAGNOSIS — R072 Precordial pain: Secondary | ICD-10-CM

## 2024-07-19 DIAGNOSIS — I2510 Atherosclerotic heart disease of native coronary artery without angina pectoris: Secondary | ICD-10-CM

## 2024-07-20 ENCOUNTER — Other Ambulatory Visit (HOSPITAL_COMMUNITY): Payer: Self-pay

## 2024-07-20 MED ORDER — AMLODIPINE BESYLATE 5 MG PO TABS
5.0000 mg | ORAL_TABLET | Freq: Every day | ORAL | 3 refills | Status: DC
  Filled 2024-07-20: qty 90, 90d supply, fill #0

## 2024-07-21 ENCOUNTER — Ambulatory Visit: Payer: Self-pay | Admitting: Physician Assistant

## 2024-07-21 ENCOUNTER — Ambulatory Visit (HOSPITAL_COMMUNITY)
Admission: RE | Admit: 2024-07-21 | Discharge: 2024-07-21 | Disposition: A | Discharge status: Home / Self Care | Source: Ambulatory Visit | Attending: Physician Assistant | Admitting: Physician Assistant

## 2024-07-21 DIAGNOSIS — H81399 Other peripheral vertigo, unspecified ear: Secondary | ICD-10-CM | POA: Diagnosis present

## 2024-07-21 DIAGNOSIS — I2583 Coronary atherosclerosis due to lipid rich plaque: Secondary | ICD-10-CM | POA: Diagnosis present

## 2024-07-21 DIAGNOSIS — I10 Essential (primary) hypertension: Secondary | ICD-10-CM | POA: Insufficient documentation

## 2024-07-21 DIAGNOSIS — R0600 Dyspnea, unspecified: Secondary | ICD-10-CM | POA: Insufficient documentation

## 2024-07-21 DIAGNOSIS — I2510 Atherosclerotic heart disease of native coronary artery without angina pectoris: Secondary | ICD-10-CM | POA: Insufficient documentation

## 2024-07-21 DIAGNOSIS — R42 Dizziness and giddiness: Secondary | ICD-10-CM | POA: Diagnosis present

## 2024-07-23 ENCOUNTER — Telehealth: Payer: Self-pay | Admitting: Physician Assistant

## 2024-07-23 NOTE — Telephone Encounter (Signed)
 Called patient to discuss results to date so far. Discussed coronary CTA results. Carotid duplex reassuring. 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 He reports that since starting the amlodipine , he has begun to see improvement in his symptoms. He has not had any more nocturnal chest burning. He has begun walking 1.5 miles a day with improvement in dyspnea and fatigue. It has not completely resolved but he is only on day 3 of the medication. He continues to have vague dizziness and headache, worse in the morning, better as the day goes on, but this is somewhat better as well. He wonders if this could perhaps be related to some neck problems he has. No syncope or pre-syncope. We discussed options of continued medical therapy and observation versus proceeding with cath. He is open to all options. Given his improvement he ultimately elevated to continue with medical therapy with close follow-up as scheduled 08/02/24, but has strict instructions to notify our team if he develops any recurrent nocturnal chest burning, new symptoms, or symptoms begin to worsen at which time we would move to cardiac catheterization. He verbalized understanding and was grateful for call.

## 2024-08-01 NOTE — Progress Notes (Unsigned)
 Cardiology Office Note    Date:  08/02/2024  ID:  Kevin Stephenson, DOB 02/10/1976, MRN [id number] PCP:  Kevin Bleacher, MD  Cardiologist:  [facility] - Kevin Bring, PA-C (initially discussed with Kevin Stephenson) Electrophysiologist:  None   Chief Complaint: f/u coronary CTA  History of Present Illness: .    Kevin Stephenson is a 48 y.o. male with visit-pertinent history of HTN, HLD managed by PCP, nocturnal Mobitz 1 AVB, former tobacco use, episodic THC use, strong family history of premature CAD (father had CABG age 79), anxiety, depression, heartburn, obesity seen for follow-up.   Around August he had two episodes of near-syncope described as fading out without full LOC that occurred on hot days working on his car outside. The episodes actually happened once he was back inside in the cooler air. He did not fully pass out. Symptoms passed without needing to seek urgent intervention. PCP ordered outpatient monitor with benign findings, average HR 75bpm, range 42-166bpm, one 6 beat, rare PACs/PVCs, 1 nocturnal dropped beat (Mobitz 1 AVB), triggers correlated to NSR/ST. There were no monitor findings that explained symptoms. Since that time he had noticed some vague dizziness, SOB, DOE, and easy fatiguability. He had had 5-6 episodes of nocturnal chest burning lasting several minutes easing off without intervention. He had not had exertional chest pain. Orthostatics were unrevealing. I had discussed case with Kevin Stephenson who felt that coronary CTA and cath were both reasonable options. The patient opted for coronary CTA. We also arranged head CT and carotid duplex. Cor CTA 07/19/24 showed CAC 2950, 50-69% mLAD, 25-49% prox LCx, 50-69% bifurcation of PDA/PLB with signal noise artifact, aortic atherosclerosis. FFR was suggestive of significant stenosis either at the distal RCA as it bifurcates into PDA and PLB or ostial PDA/PLB. Carotid duplex was near-normal. Head CT was nonacute, did note age advanced  calcified atherosclerosis at the skull base. I discussed results with Kevin Stephenson who felt that trial of medical therapy plus consideration of cardiac cath if continued symptoms could be considered. Imdur was initially deferred due to HA/dizziness and BB deferred due to fatigue. Amlodipine  was started along with ASA and titration of statin. Echo is pending.  He is seen for follow-up with his wife. They both have noticed an improvement in his breathing with the amlodipine , but he is still not completely back to his baseline. Looking back he's had worsening DOE for approximately the last year. He still had to stop and pause on the way in from the parking lot to rest a moment to catch his breath. He has not had any further nocturnal chest pain. He has not had any exertional chest pain. He still has the vague dizziness, primarily in the morning when he wakes up. He has no acute complaint today in the office.  Labwork independently reviewed: 06/2024 TSH wnl, LDL 85, trig 134, CMET ok except ALT 56, CBC wnl  ROS: .    Please see the history of present illness.  All other systems are reviewed and otherwise negative.  Studies Reviewed: SABRA    EKG:  EKG is ordered today, personally reviewed, demonstrating   EKG Interpretation Date/Time:  Wednesday August 02 2024 15:19:46 EDT Ventricular Rate:  71 PR Interval:  196 QRS Duration:  100 QT Interval:  372 QTC Calculation: 404 R Axis:   7  Text Interpretation: Normal sinus rhythm Nonspecific STTW change in lead III Otherwise unremarkable EKG Confirmed by Kevin Stephenson [phone]) on 08/02/2024 3:29:20 PM    CV Studies:  Cardiac studies reviewed are outlined and summarized above. Otherwise please see EMR for full report.   Current Reported Medications:.    Current Meds  Medication Sig   amLODipine  (NORVASC ) 5 MG tablet Take 1 tablet (5 mg total) by mouth daily.   aspirin  EC 81 MG tablet Take 1 tablet (81 mg total) by mouth daily. Swallow whole.    atorvastatin  (LIPITOR) 80 MG tablet Take 1 tablet (80 mg total) by mouth daily.   losartan  (COZAAR ) 25 MG tablet Take 1 tablet (25 mg total) by mouth daily.   pantoprazole  (PROTONIX ) 40 MG tablet Take 1 tablet (40 mg total) by mouth daily.   [DISCONTINUED] metoprolol  tartrate (LOPRESSOR ) 100 MG tablet Take 1 tablet (100 mg total) by mouth 2 HOURS PRIOR TO THE CARDIAC CT .    Physical Exam:    VS:  BP 134/83   Pulse 71   Ht 6' 2 (1.88 m)   Wt (!) 327 lb (148.3 kg)   SpO2 95%   BMI 41.98 kg/m    Wt Readings from Last 3 Encounters:  08/02/24 (!) 327 lb (148.3 kg)  07/13/24 (!) 332 lb 6.4 oz (150.8 kg)  07/12/24 (!) 325 lb 12.8 oz (147.8 kg)    GEN: Well nourished, well developed in no acute distress NECK: No JVD; No carotid bruits CARDIAC: RRR, no murmurs, rubs, gallops RESPIRATORY:  Clear to auscultation without rales, wheezing or rhonchi  ABDOMEN: Soft, non-tender, non-distended EXTREMITIES:  No edema; No acute deformity   Asessement and Plan:.    1. CAD, dizziness, near-syncope, DOE - recent monitor showed episodes of dizziness were associated with NSR, so did not appear to be rhythm related. Orthostatic VS negative last OV. He has also had some DOE and nocturnal chest burning. Coronary CTA with findings above. Per my initial discussion with Kevin Stephenson, we trialed addition of amlodipine  with return OV to revisit symptoms and discuss consideration of cath. We initially avoided BB due to fatigue and Imdur due to HA/dizziness. CT head had shown no acute findings, did note age advanced atherosclerosis at skull base which I had reviewed with Kevin Stephenson as well with plan to follow clinically since carotids were OK. The patient does report improvement in dyspnea, but still not back to baseline. He had to stop and rest walking from the parking lot into clinic. He continues to have dizziness specifically in the morning times. He has not had recurrent nocturnal chest pain or near-syncope.   Discussed case with DOD Kevin Stephenson as Kevin Stephenson is out. Given continued dyspnea on exertion combined with high calcium  score and abnormal FFR in RCA, plan to pursue definitive cardiac catheterization for evaluation. Discussed with patient and wife and he is in agreement. 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 We did discuss there would be lifting restrictions post cath, with driving restrictions TBD based on how procedure went. Get pre-cath CMET (following LFTs post statin bump last month) and CBC. Continue ASA 81mg  daily, atorvastatin  80mg  daily, amlodipine  5mg  daily.   Informed Consent   Shared Decision Making/Informed Consent The risks [stroke (1 in 1000), death (1 in 1000), kidney failure [usually temporary] (1 in 500), bleeding (1 in 200), allergic reaction [possibly serious] (1 in 200)], benefits (diagnostic support and management of coronary artery disease) and alternatives of a cardiac catheterization were discussed in detail with Mr. Nicolosi and he is willing to  proceed.     2. Essential HTN - SBP slightly above goal - continue amlodipine  5mg  daily and losartan  25mg  daily for now, pending outcome of cath. Echocardiogram also pending to help guide med titration.  3. Hyperlipidemia - LDL in 06/2024 was 85 (previously 221 three years prior). We subsequently increased atorvastatin  on 07/14/24. Note mildly elevated ALT, suspect due to fatty liver on prior scan. Will get f/u CMET with pre-cath labs today, but anticipate revisiting his lipid plan in follow-up (tentatively planned for 08/2024).  4. Mobitz type 1 AVB - noted once on monitor overnight during sleeping hours, do not suspect source of symptoms since he has had the dizziness even with normal heart rate and vitals. Consider eval for OSA once acute workup completed. He is also working on lifestyle modifications.    Disposition: F/u with me 2-3 weeks  post-cath.  Addendum 08/03/2024 4:03 PM received update that patient's insurance company [employer] denied LHC. Symptoms remain concerning for dyspnea on exertion being anginal equivalent, especially in light of severely elevated calcium  score. Peer to peer has been scheduled for 4:45pm today.  Addendum 5:08 PM: please see phone note from today. Cardiac cath has been denied per discussion with [facility] physician Dr. Maree who indicates the coronary CTA does not adequately reflect sufficient ischemia or enough information to pursue cardiac catheterization. Discussed clinical case and concerning symptoms and FFR. They indicated patient would need a study to indicate ischemia such as stress test. We will await patient's echocardiogram. If there is LV dysfunction, this would reinforce need to proceed to cardiac catheterization. If LVEF is normal, will discuss pursuit of stress test with MD to assess for ischemia burden.  I was unable to reach Mr. Chriscoe by phone so will send a mychart message with this update.   Signed, Albana Saperstein N Tynisa Vohs, PA-C

## 2024-08-02 ENCOUNTER — Encounter: Payer: Self-pay | Admitting: Physician Assistant

## 2024-08-02 ENCOUNTER — Ambulatory Visit: Attending: Cardiology | Admitting: Physician Assistant

## 2024-08-02 VITALS — BP 134/83 | HR 71 | Ht 74.0 in | Wt 327.0 lb

## 2024-08-02 DIAGNOSIS — I441 Atrioventricular block, second degree: Secondary | ICD-10-CM | POA: Insufficient documentation

## 2024-08-02 DIAGNOSIS — E785 Hyperlipidemia, unspecified: Secondary | ICD-10-CM | POA: Insufficient documentation

## 2024-08-02 DIAGNOSIS — R42 Dizziness and giddiness: Secondary | ICD-10-CM | POA: Diagnosis not present

## 2024-08-02 DIAGNOSIS — I2510 Atherosclerotic heart disease of native coronary artery without angina pectoris: Secondary | ICD-10-CM | POA: Diagnosis not present

## 2024-08-02 DIAGNOSIS — R0609 Other forms of dyspnea: Secondary | ICD-10-CM | POA: Diagnosis not present

## 2024-08-02 NOTE — Patient Instructions (Signed)
 Medication Instructions:  No change   *If you need a refill on your cardiac medications before your next appointment, please call your pharmacy*   Lab Work: CMP CBC  If you have labs (blood work) drawn today and your tests are completely normal, you will receive your results only by: MyChart Message (if you have MyChart) OR A paper copy in the mail If you have any lab test that is abnormal or we need to change your treatment, we will call you to review the results.   Testing/Procedures: Your physician has requested that you have a cardiac catheterization. Cardiac catheterization is used to diagnose and/or treat various heart conditions. Doctors may recommend this procedure for a number of different reasons. The most common reason is to evaluate chest pain. Chest pain can be a symptom of coronary artery disease (CAD), and cardiac catheterization can show whether plaque is narrowing or blocking your heart's arteries. This procedure is also used to evaluate the valves, as well as measure the blood flow and oxygen levels in different parts of your heart. Please follow instruction sheet, as given.    Follow-Up: At [facility], you and your health needs are our priority.  As part of our continuing mission to provide you with exceptional heart care, we have created designated Provider Care Teams.  These Care Teams include your primary Cardiologist (physician) and Advanced Practice Providers (APPs -  Physician Assistants and Nurse Practitioners) who all work together to provide you with the care you need, when you need it.     Your next appointment:   2 week(s) post cath  The format for your next appointment:   In Person  Provider:   Dayna Dunn, PA-C         Other Instructions   [facility] A DEPT OF [facility] [address] A DEPT OF THE [facility] [address] [place] [address] Dept: [phone] Loc:  [phone]  Kevin Stephenson  08/02/2024  You are scheduled for a Cardiac Catheterization on Monday, October 13 with Dr. Lonni Stephenson.  1. Please arrive at the [facility] (Main Entrance A) at [facility]: [address] [name], [place] [address] at 8:30 AM (This time is 2 hour(s) before your procedure to ensure your preparation).   Free valet parking service is available. You will check in at ADMITTING. The support person will be asked to wait in the waiting room.  It is OK to have someone drop you off and come back when you are ready to be discharged.    Special note: Every effort is made to have your procedure done on time. Please understand that emergencies sometimes delay scheduled procedures.  2. Diet: Nothing to eat after midnight.   3. Hydration: You need to be well hydrated before your procedure. On October 9, you may drink approved liquids (see below) until 2 hours before the procedure, with 16 oz of water as your last intake.   List of approved liquids water, clear juice, clear tea, black coffee, fruit juices, non-citric and without pulp, carbonated beverages, Gatorade, Kool -Aid, plain Jello-O and plain ice popsicles.  4. Labs: You will need to have blood drawn  ( CBC,CMP ) on Thursday, October 9 at [address] (1st Floor), [address], [place], [place] [address]. You do not need to be fasting.  5. Medication instructions in preparation for your procedure:  Contrast Allergy: No       Stop taking, Cozaar  (Losartan ) Monday, October 13,      On the morning of your procedure, take your Aspirin  81 mg and any morning medicines NOT listed above.  You may use sips of water.  6. Plan to go home the same day, you will only stay overnight if medically necessary. 7. Bring a current list of your medications and current insurance cards. 8. You MUST have a responsible person to drive you home. 9. Someone MUST be  with you the first 24 hours after you arrive home or your discharge will be delayed. 10. Please wear clothes that are easy to get on and off and wear slip-on shoes.  Thank you for allowing us  to care for you!   -- [facility]

## 2024-08-03 ENCOUNTER — Telehealth: Payer: Self-pay | Admitting: Physician Assistant

## 2024-08-03 ENCOUNTER — Ambulatory Visit: Payer: Self-pay | Admitting: Physician Assistant

## 2024-08-03 ENCOUNTER — Telehealth: Payer: Self-pay | Admitting: *Deleted

## 2024-08-03 LAB — CBC
Hematocrit: 47.6 % (ref 37.5–51.0)
Hemoglobin: 15.9 g/dL (ref 13.0–17.7)
MCH: 30.8 pg (ref 26.6–33.0)
MCHC: 33.4 g/dL (ref 31.5–35.7)
MCV: 92 fL (ref 79–97)
Platelets: 298 x10E3/uL (ref 150–450)
RBC: 5.16 x10E6/uL (ref 4.14–5.80)
RDW: 13.5 % (ref 11.6–15.4)
WBC: 8.1 x10E3/uL (ref 3.4–10.8)

## 2024-08-03 LAB — COMPREHENSIVE METABOLIC PANEL WITH GFR
ALT: 46 IU/L — AB (ref 0–44)
AST: 31 IU/L (ref 0–40)
Albumin: 4.3 g/dL (ref 4.1–5.1)
Alkaline Phosphatase: 66 IU/L (ref 47–123)
BUN/Creatinine Ratio: 13 (ref 9–20)
BUN: 12 mg/dL (ref 6–24)
Bilirubin Total: 0.5 mg/dL (ref 0.0–1.2)
CO2: 22 mmol/L (ref 20–29)
Calcium: 9.8 mg/dL (ref 8.7–10.2)
Chloride: 102 mmol/L (ref 96–106)
Creatinine, Ser: 0.91 mg/dL (ref 0.76–1.27)
Globulin, Total: 3 g/dL (ref 1.5–4.5)
Glucose: 96 mg/dL (ref 70–99)
Potassium: 4.2 mmol/L (ref 3.5–5.2)
Sodium: 140 mmol/L (ref 134–144)
Total Protein: 7.3 g/dL (ref 6.0–8.5)
eGFR: 104 mL/min/1.73 (ref >59)

## 2024-08-03 NOTE — Telephone Encounter (Signed)
Left message for patient to call back to review procedure instructions. 

## 2024-08-03 NOTE — Telephone Encounter (Addendum)
 Called [employer] to arrange P2P at 4:06pm. Was informed next available peer-to-peer time was 4:45. Was advised the call may also come within 15 minutes after scheduled time. Physician called at 5:01pm. Cardiac cath has been denied per discussion with [facility] physician Dr. Maree who indicates the coronary CTA does not sufficiently adequately reflect ischemia or enough information to pursue cardiac catheterization. Discussed clinical case and concerning symptoms. He indicated patient would need a study to indicate ischemia such as stress test. We will await his echocardiogram. If there is LV dysfunction, this would reinforce need for cardiac catheterization. If LVEF is normal, will discuss pursuit of stress test with MD to assess for ischemia burden.  I was unable to reach Mr. Matusek by phone so will send a mychart message with this update.   I will also forward to Lucienne Solomons (who sent denial information), Delon Decent (cath lab RN), and Reena Lunger, RN who helped cover me yesterday to cancel cardiac cath for now. (I do not have formal covering CMA and was previously was told that the [facility] APP Results box was not for procedural requests.) Will also cc to Jenkins Lipps as she had called patient with pre-cath instructions.

## 2024-08-03 NOTE — Telephone Encounter (Signed)
 Cardiac Catheterization scheduled at [facility] for: Monday August 07, 2024 10:30 AM Arrival time [facility] Main Entrance A at: 8:30 AM  Diet: -Nothing to eat after midnight.  Hydration: -May drink clear liquids until 2 hours before the procedure.  Approved liquids: Water, clear tea, black coffee, fruit juices-non-citric and without pulp,Gatorade, plain Jello/popsicles.   -Please drink 16 oz of water 2 hours before procedure.  Medication instructions: -Usual morning medications can be taken including aspirin  81 mg.  Plan to go home the same day, you will only stay overnight if medically necessary.  You must have responsible adult to drive you home.  Someone must be with you the first 24 hours after you arrive home.  Left message for patient to call back to review instructions.

## 2024-08-03 NOTE — Telephone Encounter (Addendum)
 Peer to peer scheduled for 4:45pm today to personal phone since out of office.   ----- Message from [place] A sent at 08/03/2024  3:31 PM EDT ----- Regarding: DENIED LHC with possible PCI Hello,  Patients scheduled cath for Monday was denied by insurance. They did review CTA, FFR, US , EKG and office notes. Looks like there maybe a reconsideration available. Please see below for case # [id number].  If you have any questions about this decision please call the Medical Director at [phone]. You may ask for reconsideration by the Medical Director and discuss the decision. A call to request reconsideration must be made within twenty-one (21) calendar days of the date of this letter.  Here are the policy requirements your request did not meet: Your doctor told us  that there is a concern related to the blood vessels in your heart. A test to look at your heart from the inside was asked for. We cannot approve this request because: Further imaging can be done if you have one of these high-risk results.  Myocardial perfusion imaging (MPI) results show at least ten percent of your heart muscle may not be getting enough blood. MPI is a type of stress test that looks at the function of your heart muscle.  Stress echocardiogram (ECHO) results show at least three segments where the test caused decreased oxygen in your heart's blood. Stress ECHO is a test that uses sound waves to assess your heart's function when it is beating fast.  An exercise treadmill test caused an abnormal change on a tracing of your heart's actions. This would be as further described in this guideline.  One of the other high-risk stress test findings listed in this guideline. The records sent to us  do not show one of these high-risk results. Imaging can be done if you still have symptoms after a 12-week trial of treatment. This trial should consist of all of these treatments.  A treatment that stops blood cells from forming a blood  clot.  A treatment that helps lower your cholesterol levels.  A drug that treats and helps prevent chest pain due to decreased oxygen in the blood in your heart muscle. This is done by working towards a goal heart rate of 60 beats per minute or less.  A drug that treats and helps prevent high blood pressure. This is done by working towards a goal blood pressure of 140/90 mmHg (millimeters of Mercury). The notes sent to us  do not show you had all of these treatments. Imaging can be done if you have heart symptoms at a low level of exercise or at rest despite treatment with the highest doses of the best treatments you can handle. The notes sent to us  do not show you have these symptoms.  Thank you ----- Message ----- From: Gladis Reena GAILS, RN Sent: 08/02/2024   6:39 PM EDT To: Reena Gladis GAILS, RN; Cv Div Heartcare Pre Ce# Subject: LHC with possible PCI                           Schedule for 08/07/24 with [place] at 10:30 am     Patient was seen by JONETTA Bring PA on 08/02/24  DX abnormal CCTA  [facility]

## 2024-08-04 NOTE — Telephone Encounter (Signed)
 See Insurance Denial Phone Note 08/03/24-cath scheduled 08/07/24 cancelled.

## 2024-08-07 ENCOUNTER — Ambulatory Visit (HOSPITAL_COMMUNITY): Admission: RE | Admit: 2024-08-07 | Source: Home / Self Care | Admitting: Cardiovascular Disease

## 2024-08-07 ENCOUNTER — Encounter (HOSPITAL_COMMUNITY): Admission: RE | Payer: Self-pay | Source: Home / Self Care

## 2024-08-07 SURGERY — LEFT HEART CATH AND CORONARY ANGIOGRAPHY
Anesthesia: LOCAL

## 2024-08-07 NOTE — Telephone Encounter (Signed)
 Patient received message via mychart on 08/03/24. LHC cancelled 08/03/24

## 2024-08-11 ENCOUNTER — Ambulatory Visit (HOSPITAL_COMMUNITY)
Admission: RE | Admit: 2024-08-11 | Discharge: 2024-08-11 | Disposition: A | Discharge status: Home / Self Care | Source: Ambulatory Visit | Attending: Physician Assistant | Admitting: Physician Assistant

## 2024-08-11 DIAGNOSIS — I441 Atrioventricular block, second degree: Secondary | ICD-10-CM | POA: Insufficient documentation

## 2024-08-11 DIAGNOSIS — R42 Dizziness and giddiness: Secondary | ICD-10-CM | POA: Insufficient documentation

## 2024-08-11 DIAGNOSIS — R55 Syncope and collapse: Secondary | ICD-10-CM | POA: Insufficient documentation

## 2024-08-11 DIAGNOSIS — H81399 Other peripheral vertigo, unspecified ear: Secondary | ICD-10-CM | POA: Insufficient documentation

## 2024-08-11 DIAGNOSIS — R0600 Dyspnea, unspecified: Secondary | ICD-10-CM | POA: Diagnosis not present

## 2024-08-11 DIAGNOSIS — R072 Precordial pain: Secondary | ICD-10-CM | POA: Insufficient documentation

## 2024-08-11 DIAGNOSIS — I10 Essential (primary) hypertension: Secondary | ICD-10-CM | POA: Diagnosis not present

## 2024-08-11 LAB — ECHOCARDIOGRAM COMPLETE
Area-P 1/2: 4.53 cm2
S' Lateral: 3.4 cm

## 2024-08-11 MED ORDER — PERFLUTREN LIPID MICROSPHERE
1.0000 mL | INTRAVENOUS | Status: AC | PRN
  Administered 2024-08-11: 2 mL via INTRAVENOUS

## 2024-08-15 ENCOUNTER — Telehealth: Payer: Self-pay

## 2024-08-15 NOTE — Telephone Encounter (Signed)
-----   Message from Dayna N Dunn sent at 08/14/2024  8:22 AM EDT ----- Regarding: RE: Question about 7 day hold slot Thank you!! I am off today, can you guys reach out to the patient and schedule it? ----- Message ----- From: Gordon Ronal SQUIBB, RN Sent: 08/14/2024   7:49 AM EDT To: Raphael LOISE Bring, PA-C Subject: RE: Question about 7 day hold slot             You can go ahead and take it! ----- Message ----- From: Bring Raphael LOISE DEVONNA Sent: 08/11/2024   5:57 PM EDT To: Ronal SQUIBB Gordon, RN Subject: Question about 7 day hold slot                 I have been intermittently discussing with [facility] about this patient on and off for the last few weeks as a patient with abnormal cor CT that I saw in clinic in [place]. His insurance company denied cath. I really think at this pt he would benefit from being seen by MD. They just don't have a cardiologist yet. Would I be able to take his 7 day hold slot on 10/31 or would we need to discuss with him first? Thanks!

## 2024-08-15 NOTE — Telephone Encounter (Signed)
 Attempted to call pt, unable to reach. LMTCB to schedule appt with Dr. Wonda.

## 2024-08-16 ENCOUNTER — Ambulatory Visit: Admitting: Physician Assistant

## 2024-08-16 NOTE — Telephone Encounter (Signed)
Left message. Will send MyChart message.

## 2024-08-16 NOTE — Telephone Encounter (Signed)
 Dunn, Dayna N, PA-C to [facility] Div [facility] Triage (Selected Message)   08/16/24 11:13 AM Hi triage, 2 things:   1) see phone note yesterday about trying to r/s appt with me into Dr. Margurite 7 day hold slot next week as I think he would benefit from establishing with MD and Dr. Wonda and I have spoken extensively about him. Please communicate with patient to find out if he can do this 2) increase amlodipine  to 10mg  daily    It is really challenging to find this line of documentation in the chart since it falls under a prior Result Note so if able to, please relocate these recs to the most recent phone note so it can be found in the most recent documentation.   Thanks so much! Dayna

## 2024-08-18 ENCOUNTER — Other Ambulatory Visit (HOSPITAL_COMMUNITY): Payer: Self-pay

## 2024-08-18 MED ORDER — AMLODIPINE BESYLATE 10 MG PO TABS
10.0000 mg | ORAL_TABLET | Freq: Every day | ORAL | 3 refills | Status: AC
  Filled 2024-08-18 – 2024-09-25 (×3): qty 90, 90d supply, fill #0

## 2024-08-18 NOTE — Telephone Encounter (Signed)
 Bringing update to this most recent phone note: patient had replied with BP readings to 07/14/24 Results Follow-Up Note therefore amlodipine  increased to 10mg  daily. He was agreeable to 10/31 f/u with Dr. Wonda, have scheduled in 2pm [facility] follow-up spot with update sent to [facility].

## 2024-08-25 ENCOUNTER — Ambulatory Visit: Attending: Cardiovascular Disease | Admitting: Cardiovascular Disease

## 2024-08-28 ENCOUNTER — Ambulatory Visit: Admitting: Physician Assistant

## 2024-08-30 ENCOUNTER — Other Ambulatory Visit (HOSPITAL_COMMUNITY): Payer: Self-pay

## 2024-09-08 ENCOUNTER — Other Ambulatory Visit: Payer: Self-pay | Admitting: Family Medicine

## 2024-09-08 ENCOUNTER — Other Ambulatory Visit: Payer: Self-pay

## 2024-09-08 ENCOUNTER — Other Ambulatory Visit (HOSPITAL_COMMUNITY): Payer: Self-pay

## 2024-09-08 MED ORDER — LOSARTAN POTASSIUM 25 MG PO TABS
25.0000 mg | ORAL_TABLET | Freq: Every day | ORAL | 0 refills | Status: AC
  Filled 2024-09-08 – 2024-09-25 (×2): qty 90, 90d supply, fill #0

## 2024-09-19 ENCOUNTER — Other Ambulatory Visit (HOSPITAL_COMMUNITY): Payer: Self-pay

## 2024-09-25 ENCOUNTER — Other Ambulatory Visit (HOSPITAL_COMMUNITY): Payer: Self-pay

## 2024-09-26 ENCOUNTER — Other Ambulatory Visit (HOSPITAL_COMMUNITY): Payer: Self-pay

## 2024-10-03 NOTE — Progress Notes (Deleted)
 Cardiology Office Note:    Date:  10/03/2024   ID:  Kevin Stephenson, DOB 11-10-1975, MRN [id number]  PCP:  [facility] Providers Cardiologist:  None { Click to update primary MD,subspecialty MD or APP then REFRESH:1}    Referring MD: Tanda Bleacher, MD   No chief complaint on file. ***  History of Present Illness:    Kevin Stephenson is a 48 y.o. male with a hx of HTN, HLD, nocturnal mobitz 1 AVB, prior tobacco abuse, episodic THC use, strong family history of premature CAD (father had CABG at age 23), anxiety, depression, heartburn, obesity. Recent outpatient monitor by PCP showed rare PACs/PVCs, and 1 nocturnal dropped beat (Mobitz 1 AVB), patient triggered events correlated with NSR/ST. He established with cardiology for chest pain and underwent CCTA that showed three vessel disease that was FFR negative. Positive FFR seen in distal RCA as it bifurcates into the PDA and PLB or ostial PDA/PBL. Medical therapy was recommended.   Echo 07/2024 demonstrated LVEF 55-60% with mild LVH, normal RV, and no significant valvular disease.   Carotid US  03/2024 with minimal plaque, no obstruction.   He presents back for follow up of these studies.      CAD - nonobstructive / FFR negative - risk factor management - continue ASA, statin   Hypertension - continue 10 mg amlodipine , 25 mg losartan    Hyperlipidemia with LDL goal < 55 - lower LDL goal given CAD and prior smoking 07/13/2024: Cholesterol, Total 147; HDL 38; LDL Chol Calc (NIH) 85; Triglycerides 134 - continue 80 mg lipitor     No past medical history on file.  Past Surgical History:  Procedure Laterality Date   APPENDECTOMY     DENTAL SURGERY  08/2019   all teeth removed    Current Medications: No outpatient medications have been marked as taking for the 10/05/24 encounter (Appointment) with Madie Jon Garre, PA.     Allergies:   Patient has no known  allergies.   Social History   Socioeconomic History   Marital status: Married    Spouse name: Not on file   Number of children: 2   Years of education: 12   Highest education level: 12th grade  Occupational History   Occupation: Truck [employer] Doctor  Tobacco Use   Smoking status: Former    Current packs/day: 0.00    Types: Cigarettes    Quit date: 2015    Years since quitting: 10.9   Smokeless tobacco: Never  Vaping Use   Vaping status: Every Day  Substance and Sexual Activity   Alcohol use: No   Drug use: No   Sexual activity: Not Currently  Other Topics Concern   Not on file  Social History Narrative   Not on file   Social Drivers of Health   Financial Resource Strain: Medium Risk (06/06/2024)   Overall Financial Resource Strain (CARDIA)    Difficulty of Paying Living Expenses: Somewhat hard  Food Insecurity: Food Insecurity Present (06/06/2024)   Hunger Vital Sign    Worried About Running Out of Food in the Last Year: Often true    Ran Out of Food in the Last Year: Sometimes true  Transportation Needs: No Transportation Needs (06/06/2024)   PRAPARE - Administrator, Civil Service (Medical): No    Lack of Transportation (Non-Medical): No  Physical Activity: Insufficiently Active (06/06/2024)   Exercise Vital Sign    Days of Exercise per Week: 4 days  Minutes of Exercise per Session: 30 min  Stress: Stress Concern Present (06/06/2024)   [employer] of Occupational Health - Occupational Stress Questionnaire    Feeling of Stress: To some extent  Social Connections: Unknown (06/06/2024)   Social Connection and Isolation Panel    Frequency of Communication with Friends and Family: Twice a week    Frequency of Social Gatherings with Friends and Family: Patient declined    Attends Religious Services: 1 to 4 times per year    Active Member of [employer] or Organizations: No    Attends Engineer, Structural: Not on file    Marital Status: Married      Family History: The patient's ***family history includes Colon cancer in his father; Heart disease in his father; Heart failure in his father.  ROS:   Please see the history of present illness.    *** All other systems reviewed and are negative.  EKGs/Labs/Other Studies Reviewed:    The following studies were reviewed today: ***      Recent Labs: 07/13/2024: TSH 1.840 08/02/2024: ALT 46; BUN 12; Creatinine, Ser 0.91; Hemoglobin 15.9; Platelets 298; Potassium 4.2; Sodium 140  Recent Lipid Panel    Component Value Date/Time   CHOL 147 07/13/2024 0930   TRIG 134 07/13/2024 0930   HDL 38 (L) 07/13/2024 0930   CHOLHDL 3.9 07/13/2024 0930   LDLCALC 85 07/13/2024 0930     Risk Assessment/Calculations:   {Does this patient have ATRIAL FIBRILLATION?[phone]}  No BP recorded.  {Refresh Note OR Click here to enter BP  :1}***         Physical Exam:    VS:  There were no vitals taken for this visit.    Wt Readings from Last 3 Encounters:  08/02/24 (!) 327 lb (148.3 kg)  07/13/24 (!) 332 lb 6.4 oz (150.8 kg)  07/12/24 (!) 325 lb 12.8 oz (147.8 kg)     GEN: *** Well nourished, well developed in no acute distress HEENT: Normal NECK: No JVD; No carotid bruits LYMPHATICS: No lymphadenopathy CARDIAC: ***RRR, no murmurs, rubs, gallops RESPIRATORY:  Clear to auscultation without rales, wheezing or rhonchi  ABDOMEN: Soft, non-tender, non-distended MUSCULOSKELETAL:  No edema; No deformity  SKIN: Warm and dry NEUROLOGIC:  Alert and oriented x 3 PSYCHIATRIC:  Normal affect   ASSESSMENT:    No diagnosis found. PLAN:    In order of problems listed above:  ***      {Are you ordering a CV Procedure (e.g. stress test, cath, DCCV, TEE, etc)?   Press F2        :[id number]}    Medication Adjustments/Labs and Tests Ordered: Current medicines are reviewed at length with the patient today.  Concerns regarding medicines are outlined above.  No orders of the defined types were  placed in this encounter.  No orders of the defined types were placed in this encounter.   There are no Patient Instructions on file for this visit.   Signed, Jon Nat Hails, PA  10/03/2024 4:24 PM    [facility]

## 2024-10-05 ENCOUNTER — Ambulatory Visit: Attending: Internal Medicine | Admitting: Physician Assistant
